# Patient Record
Sex: Male | Born: 1965
Health system: Southern US, Community
[De-identification: ages and names within clinical notes are randomized; demographics above are authoritative.]

## PROBLEM LIST (undated history)

## (undated) DIAGNOSIS — E785 Hyperlipidemia, unspecified: Secondary | ICD-10-CM

## (undated) DIAGNOSIS — K219 Gastro-esophageal reflux disease without esophagitis: Secondary | ICD-10-CM

## (undated) DIAGNOSIS — I10 Essential (primary) hypertension: Secondary | ICD-10-CM

## (undated) DIAGNOSIS — F32A Depression, unspecified: Secondary | ICD-10-CM

## (undated) DIAGNOSIS — C44602 Unspecified malignant neoplasm of skin of right upper limb, including shoulder: Secondary | ICD-10-CM

## (undated) DIAGNOSIS — G43909 Migraine, unspecified, not intractable, without status migrainosus: Secondary | ICD-10-CM

## (undated) HISTORY — PX: APPENDECTOMY: SHX54

## (undated) HISTORY — DX: Essential (primary) hypertension: I10

## (undated) HISTORY — DX: Migraine, unspecified, not intractable, without status migrainosus: G43.909

## (undated) HISTORY — DX: Hyperlipidemia, unspecified: E78.5

## (undated) HISTORY — DX: Unspecified malignant neoplasm of skin of right upper limb, including shoulder: C44.602

---

## 2015-10-20 LAB — HM COLONOSCOPY

## 2017-09-23 DIAGNOSIS — Z23 Encounter for immunization: Secondary | ICD-10-CM | POA: Diagnosis not present

## 2020-04-02 ENCOUNTER — Other Ambulatory Visit: Payer: Self-pay | Admitting: Physician Assistant

## 2020-09-19 ENCOUNTER — Other Ambulatory Visit: Payer: Self-pay

## 2020-09-19 ENCOUNTER — Ambulatory Visit: Payer: 59 | Admitting: Physician Assistant

## 2020-09-19 ENCOUNTER — Encounter: Payer: Self-pay | Admitting: Physician Assistant

## 2020-09-19 VITALS — BP 132/82 | HR 78 | Temp 98.0°F | Ht 67.0 in | Wt 190.8 lb

## 2020-09-19 DIAGNOSIS — I10 Essential (primary) hypertension: Secondary | ICD-10-CM

## 2020-09-19 DIAGNOSIS — R0789 Other chest pain: Secondary | ICD-10-CM

## 2020-09-19 DIAGNOSIS — E782 Mixed hyperlipidemia: Secondary | ICD-10-CM

## 2020-09-19 DIAGNOSIS — R5383 Other fatigue: Secondary | ICD-10-CM

## 2020-09-19 HISTORY — DX: Essential (primary) hypertension: I10

## 2020-09-19 HISTORY — DX: Other chest pain: R07.89

## 2020-09-19 HISTORY — DX: Mixed hyperlipidemia: E78.2

## 2020-09-19 HISTORY — DX: Other fatigue: R53.83

## 2020-09-19 MED ORDER — IBUPROFEN 800 MG PO TABS: 800.0000 mg | ORAL_TABLET | Freq: Three times a day (TID) | ORAL | 1 refills | Status: DC | PRN

## 2020-09-19 MED ORDER — ATENOLOL 25 MG PO TABS: 25.0000 mg | ORAL_TABLET | Freq: Every day | ORAL | 1 refills | Status: DC

## 2020-09-19 MED ORDER — OMEPRAZOLE 40 MG PO CPDR: 40.0000 mg | DELAYED_RELEASE_CAPSULE | Freq: Every day | ORAL | 1 refills | Status: DC

## 2020-09-19 NOTE — Assessment & Plan Note (Signed)
Recommend referral to cardiology Recommend to stop smoking Recommend to restart statin

## 2020-09-19 NOTE — Assessment & Plan Note (Signed)
Well controlled.  ?No changes to medicines.  ?Continue to work on eating a healthy diet and exercise.  ?Labs drawn today.  ?

## 2020-09-19 NOTE — Progress Notes (Signed)
Established Patient Office Visit  Subjective:  Patient ID: Darren Lyons, male    DOB: May 09, 1966  Age: 54 y.o. MRN: [id number]  CC:  Chief Complaint  Patient presents with  . Hypertension    HPI Darren Lyons presents for hypertension  Pt presents for follow up of hypertension.  The patient is tolerating the medication well without side effects. Compliance with treatment has been fair; including taking medication as directed ,  However pt states that for over a year he has noted he has occasional chest tightness - has noted it with exertion and it does cause pain down left arm - says he seems more fatigued than usual and more with exertion Last episode was several weeks ago  Pt with history of hyperlipidemia - he states that he stopped the chol med because he had 'kidney pain' - says it went away after a week off medication - - recommend to restart med but will wait for lab results  Pt uses omeprazole for GERD - states works well for him Past Medical History:  Diagnosis Date  . Hyperlipidemia   . Hypertension   . Migraine   . Skin cancer of arm, right     Past Surgical History:  Procedure Laterality Date  . APPENDECTOMY      Family History  Problem Relation Age of Onset  . Diabetes Mother   . Hypertension Mother   . Cancer Father     Social History   Socioeconomic History  . Marital status: Unknown    Spouse name: Not on file  . Number of children: Not on file  . Years of education: Not on file  . Highest education level: Not on file  Occupational History  . Not on file  Tobacco Use  . Smoking status: Current Every Day Smoker    Packs/day: 1.00    Types: Cigarettes  . Smokeless tobacco: Never Used  Vaping Use  . Vaping Use: Never used  Substance and Sexual Activity  . Alcohol use: Never  . Drug use: Never  . Sexual activity: Not on file  Other Topics Concern  . Not on file  Social History Narrative  . Not on file   Social Determinants of Health    Financial Resource Strain:   . Difficulty of Paying Living Expenses: Not on file  Food Insecurity:   . Worried About Charity fundraiser in the Last Year: Not on file  . Ran Out of Food in the Last Year: Not on file  Transportation Needs:   . Lack of Transportation (Medical): Not on file  . Lack of Transportation (Non-Medical): Not on file  Physical Activity:   . Days of Exercise per Week: Not on file  . Minutes of Exercise per Session: Not on file  Stress:   . Feeling of Stress : Not on file  Social Connections:   . Frequency of Communication with Friends and Family: Not on file  . Frequency of Social Gatherings with Friends and Family: Not on file  . Attends Religious Services: Not on file  . Active Member of Clubs or Organizations: Not on file  . Attends Archivist Meetings: Not on file  . Marital Status: Not on file  Intimate Partner Violence:   . Fear of Current or Ex-Partner: Not on file  . Emotionally Abused: Not on file  . Physically Abused: Not on file  . Sexually Abused: Not on file     Current Outpatient Medications:  .  atenolol (TENORMIN) 25 MG tablet, Take 1 tablet by mouth once daily, Disp: 90 tablet, Rfl: 0 .  ibuprofen (ADVIL) 800 MG tablet, TAKE 1 TABLET BY MOUTH EVERY 8 HOURS AS NEEDED FOR PAIN, Disp: 90 tablet, Rfl: 0 .  omeprazole (PRILOSEC) 40 MG capsule, Take 1 capsule by mouth once daily, Disp: 90 capsule, Rfl: 0   Allergies  Allergen Reactions  . Codeine   . Pseudoephedrine Hcl   . Zyrtec [Cetirizine]     ROS CONSTITUTIONAL: Negative for chills, fatigue, fever, unintentional weight gain and unintentional weight loss.  E/N/T: Negative for ear pain, nasal congestion and sore throat.  CARDIOVASCULAR: see HPI  RESPIRATORY: Negative for recent cough and dyspnea.  GASTROINTESTINAL: Negative for abdominal pain, acid reflux symptoms, constipation, diarrhea, nausea and vomiting.  MSK: Negative for arthralgias and myalgias.  INTEGUMENTARY:  Negative for rash.  NEUROLOGICAL: Negative for dizziness and headaches.  PSYCHIATRIC: Negative for sleep disturbance and to question depression screen.  Negative for depression, negative for anhedonia.        Objective:    PHYSICAL EXAM:   VS: BP 132/82 (BP Location: Left Arm, Patient Position: Sitting, Cuff Size: Normal)   Pulse 78   Temp 98 F (36.7 C) (Temporal)   Ht 5' 7"  (1.702 m)   Wt 190 lb 12.8 oz (86.5 kg)   SpO2 99%   BMI 29.88 kg/m   GEN: Well nourished, well developed, in no acute distress  Cardiac: RRR; no murmurs, rubs, or gallops,no edema -  Respiratory:  normal respiratory rate and pattern with no distress - normal breath sounds with no rales, rhonchi, wheezes or rubs GI: normal bowel sounds, no masses or tenderness MS: no deformity or atrophy  Skin: warm and dry, no rash  Neuro:  Alert and Oriented x 3, Strength and sensation are intact - CN II-Xii grossly intact Psych: euthymic mood, appropriate affect and demeanor  BP 132/82 (BP Location: Left Arm, Patient Position: Sitting, Cuff Size: Normal)   Pulse 78   Temp 98 F (36.7 C) (Temporal)   Ht 5' 7"  (1.702 m)   Wt 190 lb 12.8 oz (86.5 kg)   SpO2 99%   BMI 29.88 kg/m  Wt Readings from Last 3 Encounters:  09/19/20 190 lb 12.8 oz (86.5 kg)   ekg - t wave changes - no acute  There are no preventive care reminders to display for this patient.  There are no preventive care reminders to display for this patient.  No results found for: TSH No results found for: WBC, HGB, HCT, MCV, PLT No results found for: NA, K, CHLORIDE, CO2, GLUCOSE, BUN, CREATININE, BILITOT, ALKPHOS, AST, ALT, PROT, ALBUMIN, CALCIUM, ANIONGAP, EGFR, GFR No results found for: CHOL No results found for: HDL No results found for: LDLCALC No results found for: TRIG No results found for: CHOLHDL No results found for: HGBA1C    Assessment & Plan:   Problem List Items Addressed This Visit      Cardiovascular and Mediastinum    Essential hypertension - Primary    Well controlled.  No changes to medicines.  Continue to work on eating a healthy diet and exercise.  Labs drawn today.        Relevant Orders   CBC with Differential/Platelet   Comprehensive metabolic panel   TSH     Other   Mixed hyperlipidemia     Labs drawn today.  Will recommend to restart statin - to see lab results first  Relevant Orders   Lipid panel   Other fatigue    labwork pending      Relevant Orders   CBC with Differential/Platelet   Comprehensive metabolic panel   TSH   Other chest pain    Recommend referral to cardiology Recommend to stop smoking Recommend to restart statin      Relevant Orders   CBC with Differential/Platelet   Comprehensive metabolic panel   TSH   Lipid panel   EKG 12-Lead   Ambulatory referral to Cardiology      No orders of the defined types were placed in this encounter.   Follow-up: Return in about 6 months (around 03/19/2021) for chronic fasting follow up.    SARA R Airiel Oblinger, PA-C

## 2020-09-19 NOTE — Assessment & Plan Note (Signed)
  Labs drawn today.  Will recommend to restart statin - to see lab results first

## 2020-09-19 NOTE — Assessment & Plan Note (Signed)
labwork pending 

## 2020-09-20 LAB — COMPREHENSIVE METABOLIC PANEL
ALT: 20 IU/L (ref 0–44)
AST: 19 IU/L (ref 0–40)
Albumin/Globulin Ratio: 2.4 — ABNORMAL HIGH (ref 1.2–2.2)
Albumin: 4.8 g/dL (ref 3.8–4.9)
Alkaline Phosphatase: 96 IU/L (ref 44–121)
BUN/Creatinine Ratio: 15 (ref 9–20)
BUN: 15 mg/dL (ref 6–24)
Bilirubin Total: 0.6 mg/dL (ref 0.0–1.2)
CO2: 25 mmol/L (ref 20–29)
Calcium: 9.6 mg/dL (ref 8.7–10.2)
Chloride: 100 mmol/L (ref 96–106)
Creatinine, Ser: 1.01 mg/dL (ref 0.76–1.27)
GFR calc Af Amer: 96 mL/min/{1.73_m2} (ref >59)
GFR calc non Af Amer: 83 mL/min/{1.73_m2} (ref >59)
Globulin, Total: 2 g/dL (ref 1.5–4.5)
Glucose: 91 mg/dL (ref 65–99)
Potassium: 4.5 mmol/L (ref 3.5–5.2)
Sodium: 138 mmol/L (ref 134–144)
Total Protein: 6.8 g/dL (ref 6.0–8.5)

## 2020-09-20 LAB — CBC WITH DIFFERENTIAL/PLATELET
Basophils Absolute: 0.1 10*3/uL (ref 0.0–0.2)
Basos: 1 %
EOS (ABSOLUTE): 0.2 10*3/uL (ref 0.0–0.4)
Eos: 2 %
Hematocrit: 46.1 % (ref 37.5–51.0)
Hemoglobin: 15.7 g/dL (ref 13.0–17.7)
Immature Grans (Abs): 0 10*3/uL (ref 0.0–0.1)
Immature Granulocytes: 0 %
Lymphocytes Absolute: 2.6 10*3/uL (ref 0.7–3.1)
Lymphs: 33 %
MCH: 29.5 pg (ref 26.6–33.0)
MCHC: 34.1 g/dL (ref 31.5–35.7)
MCV: 87 fL (ref 79–97)
Monocytes Absolute: 0.4 10*3/uL (ref 0.1–0.9)
Monocytes: 6 %
Neutrophils Absolute: 4.6 10*3/uL (ref 1.4–7.0)
Neutrophils: 58 %
Platelets: 181 10*3/uL (ref 150–450)
RBC: 5.32 x10E6/uL (ref 4.14–5.80)
RDW: 13.6 % (ref 11.6–15.4)
WBC: 7.9 10*3/uL (ref 3.4–10.8)

## 2020-09-20 LAB — TSH: TSH: 2.21 u[IU]/mL (ref 0.450–4.500)

## 2020-09-20 LAB — LIPID PANEL
Chol/HDL Ratio: 7.8 ratio — ABNORMAL HIGH (ref 0.0–5.0)
Cholesterol, Total: 241 mg/dL — ABNORMAL HIGH (ref 100–199)
HDL: 31 mg/dL — ABNORMAL LOW (ref >39)
LDL Chol Calc (NIH): 163 mg/dL — ABNORMAL HIGH (ref 0–99)
Triglycerides: 249 mg/dL — ABNORMAL HIGH (ref 0–149)
VLDL Cholesterol Cal: 47 mg/dL — ABNORMAL HIGH (ref 5–40)

## 2020-09-20 LAB — CARDIOVASCULAR RISK ASSESSMENT

## 2020-09-21 ENCOUNTER — Other Ambulatory Visit: Payer: Self-pay | Admitting: Physician Assistant

## 2020-09-21 MED ORDER — ROSUVASTATIN CALCIUM 10 MG PO TABS: 10.0000 mg | ORAL_TABLET | Freq: Every day | ORAL | 0 refills | Status: DC

## 2020-09-28 DIAGNOSIS — C44602 Unspecified malignant neoplasm of skin of right upper limb, including shoulder: Secondary | ICD-10-CM | POA: Insufficient documentation

## 2020-09-28 DIAGNOSIS — I10 Essential (primary) hypertension: Secondary | ICD-10-CM | POA: Insufficient documentation

## 2020-09-28 DIAGNOSIS — E785 Hyperlipidemia, unspecified: Secondary | ICD-10-CM | POA: Insufficient documentation

## 2020-09-28 DIAGNOSIS — G43909 Migraine, unspecified, not intractable, without status migrainosus: Secondary | ICD-10-CM | POA: Insufficient documentation

## 2020-09-29 ENCOUNTER — Other Ambulatory Visit: Payer: Self-pay

## 2020-09-29 ENCOUNTER — Ambulatory Visit: Payer: 59 | Admitting: Cardiology

## 2020-09-29 ENCOUNTER — Encounter: Payer: Self-pay | Admitting: Cardiology

## 2020-09-29 VITALS — BP 142/90 | HR 60 | Ht 66.0 in | Wt 188.4 lb

## 2020-09-29 DIAGNOSIS — I209 Angina pectoris, unspecified: Secondary | ICD-10-CM | POA: Diagnosis not present

## 2020-09-29 DIAGNOSIS — E782 Mixed hyperlipidemia: Secondary | ICD-10-CM | POA: Diagnosis not present

## 2020-09-29 DIAGNOSIS — F17210 Nicotine dependence, cigarettes, uncomplicated: Secondary | ICD-10-CM

## 2020-09-29 DIAGNOSIS — I10 Essential (primary) hypertension: Secondary | ICD-10-CM | POA: Diagnosis not present

## 2020-09-29 MED ORDER — ASPIRIN EC 81 MG PO TBEC: 81.0000 mg | DELAYED_RELEASE_TABLET | Freq: Every day | ORAL | 3 refills | Status: DC

## 2020-09-29 MED ORDER — NITROGLYCERIN 0.4 MG SL SUBL: 0.4000 mg | SUBLINGUAL_TABLET | SUBLINGUAL | 3 refills | Status: DC | PRN

## 2020-09-29 NOTE — Progress Notes (Signed)
Cardiology Office Note:    Date:  09/29/2020   ID:  Darren Lyons, DOB [birth year], MRN [id number]  PCP:  Marge Duncans, PA-C  Cardiologist:  Jenean Lindau, MD   Referring MD: Marge Duncans, PA-C    ASSESSMENT:    1. Essential hypertension   2. Mixed hyperlipidemia   3. Angina pectoris ([place])   4. Cigarette smoker    PLAN:    In order of problems listed above:  1. Angina pectoris: Patient symptoms are very concerning and I discussed this with him and his wife at extensive length.  I told him to rest and relax till his further evaluation.  Advised him the following.  He will take aspirin 81 mg daily coated.  Sublingual nitroglycerin prescription was sent, its protocol and 911 protocol explained and the patient vocalized understanding questions were answered to the patient's satisfaction.In view of the patient's symptoms, I discussed with the patient options for evaluation. Invasive and noninvasive options were given to the patient. I discussed stress testing and coronary angiography and left heart catheterization at length. Benefits, pros and cons of each approach were discussed at length. Patient had multiple questions which were answered to the patient's satisfaction. Patient opted for invasive evaluation and we will set up for coronary angiography and left heart catheterization. Further recommendations will be made based on the findings with coronary angiography. In the interim if the patient has any significant symptoms in hospital to the nearest emergency room.  I also suggested CT coronary angiography with FFR but is not keen on it. 2. Essential hypertension: Blood pressure is mildly elevated but he appears anxious today because of obvious reasons 3. Mixed dyslipidemia: Diet was emphasized.  His doctor has already started him on statin therapy and I think that is appropriate 4. Cigarette smoker: I spent 5 minutes with the patient discussing solely about smoking. Smoking cessation was  counseled. I suggested to the patient also different medications and pharmacological interventions. Patient is keen to try stopping on its own at this time. He will get back to me if he needs any further assistance in this matter. 5. He will be seen in follow-up appointment after the coronary angiography.   Medication Adjustments/Labs and Tests Ordered: Current medicines are reviewed at length with the patient today.  Concerns regarding medicines are outlined above.  Orders Placed This Encounter  Procedures  . Basic metabolic panel  . CBC   Meds ordered this encounter  Medications  . aspirin EC 81 MG tablet    Sig: Take 1 tablet (81 mg total) by mouth daily. Swallow whole.    Dispense:  90 tablet    Refill:  3  . nitroGLYCERIN (NITROSTAT) 0.4 MG SL tablet    Sig: Place 1 tablet (0.4 mg total) under the tongue every 5 (five) minutes as needed for chest pain.    Dispense:  90 tablet    Refill:  3     History of Present Illness:    Darren Lyons is a 54 y.o. male who is being seen today for the evaluation of chest pain at the request of Marge Duncans, Hershal Coria.  Patient is a pleasant 54 year old male.  He has past medical history of essential hypertension dyslipidemia, sedentary lifestyle and active heavy smoker.  Patient has been referred to me for chest pain.  He is wife accompanies him for this visit.  He mentions to me that he has chest tightness going to the neck into the arm.  This occurs on exertion.  He has never used nitroglycerin.  He is concerned about this.  Also sexual activity being around the symptoms and so he has been very concerned about not being sexually active because of recurrence of symptoms.  At the time of my evaluation, the patient is alert awake oriented and in no distress.  Past Medical History:  Diagnosis Date  . Essential hypertension 09/19/2020  . Hyperlipidemia   . Hypertension   . Migraine   . Mixed hyperlipidemia 09/19/2020  . Other chest pain 09/19/2020  .  Other fatigue 09/19/2020  . Skin cancer of arm, right     Past Surgical History:  Procedure Laterality Date  . APPENDECTOMY      Current Medications: Current Meds  Medication Sig  . atenolol (TENORMIN) 25 MG tablet Take 1 tablet (25 mg total) by mouth daily.  Marland Kitchen ibuprofen (ADVIL) 800 MG tablet Take 1 tablet (800 mg total) by mouth every 8 (eight) hours as needed. for pain  . omeprazole (PRILOSEC) 40 MG capsule Take 1 capsule (40 mg total) by mouth daily.  . rosuvastatin (CRESTOR) 10 MG tablet Take 1 tablet (10 mg total) by mouth daily.     Allergies:   Codeine, Pseudoephedrine hcl, and Zyrtec [cetirizine]   Social History   Socioeconomic History  . Marital status: Unknown    Spouse name: Not on file  . Number of children: Not on file  . Years of education: Not on file  . Highest education level: Not on file  Occupational History  . Not on file  Tobacco Use  . Smoking status: Current Every Day Smoker    Packs/day: 1.00    Types: Cigarettes  . Smokeless tobacco: Never Used  Vaping Use  . Vaping Use: Never used  Substance and Sexual Activity  . Alcohol use: Never  . Drug use: Never  . Sexual activity: Not on file  Other Topics Concern  . Not on file  Social History Narrative  . Not on file   Social Determinants of Health   Financial Resource Strain:   . Difficulty of Paying Living Expenses: Not on file  Food Insecurity:   . Worried About Charity fundraiser in the Last Year: Not on file  . Ran Out of Food in the Last Year: Not on file  Transportation Needs:   . Lack of Transportation (Medical): Not on file  . Lack of Transportation (Non-Medical): Not on file  Physical Activity:   . Days of Exercise per Week: Not on file  . Minutes of Exercise per Session: Not on file  Stress:   . Feeling of Stress : Not on file  Social Connections:   . Frequency of Communication with Friends and Family: Not on file  . Frequency of Social Gatherings with Friends and Family:  Not on file  . Attends Religious Services: Not on file  . Active Member of Clubs or Organizations: Not on file  . Attends Archivist Meetings: Not on file  . Marital Status: Not on file     Family History: The patient's family history includes Cancer in his father; Diabetes in his mother; Hypertension in his mother.  ROS:   Please see the history of present illness.    All other systems reviewed and are negative.  EKGs/Labs/Other Studies Reviewed:    The following studies were reviewed today: EKG with sinus rhythm and veh.   Recent Labs: 09/19/2020: ALT 20; BUN 15; Creatinine, Ser 1.01; Hemoglobin 15.7; Platelets 181; Potassium 4.5; Sodium  138; TSH 2.210  Recent Lipid Panel    Component Value Date/Time   CHOL 241 (H) 09/19/2020 1008   TRIG 249 (H) 09/19/2020 1008   HDL 31 (L) 09/19/2020 1008   CHOLHDL 7.8 (H) 09/19/2020 1008   LDLCALC 163 (H) 09/19/2020 1008    Physical Exam:    VS:  BP (!) 142/90 (BP Location: Left Arm, Patient Position: Sitting, Cuff Size: Normal)   Pulse 60   Ht 5\' 6"  (1.676 m)   Wt 188 lb 6.4 oz (85.5 kg)   SpO2 99%   BMI 30.41 kg/m     Wt Readings from Last 3 Encounters:  09/29/20 188 lb 6.4 oz (85.5 kg)  09/19/20 190 lb 12.8 oz (86.5 kg)     GEN: Patient is in no acute distress HEENT: Normal NECK: No JVD; No carotid bruits LYMPHATICS: No lymphadenopathy CARDIAC: S1 S2 regular, 2/6 systolic murmur at the apex. RESPIRATORY:  Clear to auscultation without rales, wheezing or rhonchi  ABDOMEN: Soft, non-tender, non-distended MUSCULOSKELETAL:  No edema; No deformity  SKIN: Warm and dry NEUROLOGIC:  Alert and oriented x 3 PSYCHIATRIC:  Normal affect    Signed, Jenean Lindau, MD  09/29/2020 9:10 AM    [place]

## 2020-09-29 NOTE — Patient Instructions (Addendum)
Medication Instructions:  Your physician has recommended you make the following change in your medication:  START: Nitroglycerin 0.4 mg take one tablet by mouth every 5 minutes as needed for chest pain.  START: Aspirin 81 mg take one tablet by mouth daily.  *If you need a refill on your cardiac medications before your next appointment, please call your pharmacy*   Lab Work: None If you have labs (blood work) drawn today and your tests are completely normal, you will receive your results only by: Marland Kitchen MyChart Message (if you have MyChart) OR . A paper copy in the mail If you have any lab test that is abnormal or we need to change your treatment, we will call you to review the results.   Testing/Procedures:    [facility] [address] Dept: [phone] Loc: [place]  09/29/2020  You are scheduled for a Cardiac Catheterization on Monday, October 11 with Dr. Shelva Majestic.  1. Please arrive at the [facility] (Main Entrance A) at [facility]: [address] [place], [place] [address] at 8:30 AM (This time is two hours before your procedure to ensure your preparation). Free valet parking service is available.   Special note: Every effort is made to have your procedure done on time. Please understand that emergencies sometimes delay scheduled procedures.  2. Diet: Do not eat solid foods after midnight.  The patient may have clear liquids until 5am upon the day of the procedure.  3. Labs: You had your labs completed on 09/19/20  4. Medication instructions in preparation for your procedure:   Contrast Allergy: No   On the morning of your procedure, take your Aspirin and any morning medicines NOT listed above.  You may use sips of water.  5. Plan for one night stay--bring personal belongings. 6. Bring a current list of your medications and current insurance  cards. 7. You MUST have a responsible person to drive you home. 8. Someone MUST be with you the first 24 hours after you arrive home or your discharge will be delayed. 9. Please wear clothes that are easy to get on and off and wear slip-on shoes.  Thank you for allowing [place] to care for you!   --  Invasive Cardiovascular services    Follow-Up: At [facility], you and your health needs are our priority.  As part of our continuing mission to provide you with exceptional heart care, we have created designated Provider Care Teams.  These Care Teams include your primary Cardiologist (physician) and Advanced Practice Providers (APPs -  Physician Assistants and Nurse Practitioners) who all work together to provide you with the care you need, when you need it.  We recommend signing up for the patient portal called "MyChart".  Sign up information is provided on this After Visit Summary.  MyChart is used to connect with patients for Virtual Visits (Telemedicine).  Patients are able to view lab/test results, encounter notes, upcoming appointments, etc.  Non-urgent messages can be sent to your provider as well.   To learn more about what you can do with MyChart, go to [email or web address].    Your next appointment:   1 month(s)  The format for your next appointment:   In Person  Provider:   Jyl Heinz, MD   Other Instructions

## 2020-09-29 NOTE — H&P (View-Only) (Signed)
Cardiology Office Note:    Date:  09/29/2020   ID:  Darren Lyons, DOB [birth year], MRN [id number]  PCP:  Marge Duncans, PA-C  Cardiologist:  Jenean Lindau, MD   Referring MD: Marge Duncans, PA-C    ASSESSMENT:    1. Essential hypertension   2. Mixed hyperlipidemia   3. Angina pectoris ([place])   4. Cigarette smoker    PLAN:    In order of problems listed above:  1. Angina pectoris: Patient symptoms are very concerning and I discussed this with him and his wife at extensive length.  I told him to rest and relax till his further evaluation.  Advised him the following.  He will take aspirin 81 mg daily coated.  Sublingual nitroglycerin prescription was sent, its protocol and 911 protocol explained and the patient vocalized understanding questions were answered to the patient's satisfaction.In view of the patient's symptoms, I discussed with the patient options for evaluation. Invasive and noninvasive options were given to the patient. I discussed stress testing and coronary angiography and left heart catheterization at length. Benefits, pros and cons of each approach were discussed at length. Patient had multiple questions which were answered to the patient's satisfaction. Patient opted for invasive evaluation and we will set up for coronary angiography and left heart catheterization. Further recommendations will be made based on the findings with coronary angiography. In the interim if the patient has any significant symptoms in hospital to the nearest emergency room.  I also suggested CT coronary angiography with FFR but is not keen on it. 2. Essential hypertension: Blood pressure is mildly elevated but he appears anxious today because of obvious reasons 3. Mixed dyslipidemia: Diet was emphasized.  His doctor has already started him on statin therapy and I think that is appropriate 4. Cigarette smoker: I spent 5 minutes with the patient discussing solely about smoking. Smoking cessation was  counseled. I suggested to the patient also different medications and pharmacological interventions. Patient is keen to try stopping on its own at this time. He will get back to me if he needs any further assistance in this matter. 5. He will be seen in follow-up appointment after the coronary angiography.   Medication Adjustments/Labs and Tests Ordered: Current medicines are reviewed at length with the patient today.  Concerns regarding medicines are outlined above.  Orders Placed This Encounter  Procedures  . Basic metabolic panel  . CBC   Meds ordered this encounter  Medications  . aspirin EC 81 MG tablet    Sig: Take 1 tablet (81 mg total) by mouth daily. Swallow whole.    Dispense:  90 tablet    Refill:  3  . nitroGLYCERIN (NITROSTAT) 0.4 MG SL tablet    Sig: Place 1 tablet (0.4 mg total) under the tongue every 5 (five) minutes as needed for chest pain.    Dispense:  90 tablet    Refill:  3     History of Present Illness:    Darren Lyons is a 54 y.o. male who is being seen today for the evaluation of chest pain at the request of Marge Duncans, Hershal Coria.  Patient is a pleasant 54 year old male.  He has past medical history of essential hypertension dyslipidemia, sedentary lifestyle and active heavy smoker.  Patient has been referred to me for chest pain.  He is wife accompanies him for this visit.  He mentions to me that he has chest tightness going to the neck into the arm.  This occurs on exertion.  He has never used nitroglycerin.  He is concerned about this.  Also sexual activity being around the symptoms and so he has been very concerned about not being sexually active because of recurrence of symptoms.  At the time of my evaluation, the patient is alert awake oriented and in no distress.  Past Medical History:  Diagnosis Date  . Essential hypertension 09/19/2020  . Hyperlipidemia   . Hypertension   . Migraine   . Mixed hyperlipidemia 09/19/2020  . Other chest pain 09/19/2020  .  Other fatigue 09/19/2020  . Skin cancer of arm, right     Past Surgical History:  Procedure Laterality Date  . APPENDECTOMY      Current Medications: Current Meds  Medication Sig  . atenolol (TENORMIN) 25 MG tablet Take 1 tablet (25 mg total) by mouth daily.  Marland Kitchen ibuprofen (ADVIL) 800 MG tablet Take 1 tablet (800 mg total) by mouth every 8 (eight) hours as needed. for pain  . omeprazole (PRILOSEC) 40 MG capsule Take 1 capsule (40 mg total) by mouth daily.  . rosuvastatin (CRESTOR) 10 MG tablet Take 1 tablet (10 mg total) by mouth daily.     Allergies:   Codeine, Pseudoephedrine hcl, and Zyrtec [cetirizine]   Social History   Socioeconomic History  . Marital status: Unknown    Spouse name: Not on file  . Number of children: Not on file  . Years of education: Not on file  . Highest education level: Not on file  Occupational History  . Not on file  Tobacco Use  . Smoking status: Current Every Day Smoker    Packs/day: 1.00    Types: Cigarettes  . Smokeless tobacco: Never Used  Vaping Use  . Vaping Use: Never used  Substance and Sexual Activity  . Alcohol use: Never  . Drug use: Never  . Sexual activity: Not on file  Other Topics Concern  . Not on file  Social History Narrative  . Not on file   Social Determinants of Health   Financial Resource Strain:   . Difficulty of Paying Living Expenses: Not on file  Food Insecurity:   . Worried About Charity fundraiser in the Last Year: Not on file  . Ran Out of Food in the Last Year: Not on file  Transportation Needs:   . Lack of Transportation (Medical): Not on file  . Lack of Transportation (Non-Medical): Not on file  Physical Activity:   . Days of Exercise per Week: Not on file  . Minutes of Exercise per Session: Not on file  Stress:   . Feeling of Stress : Not on file  Social Connections:   . Frequency of Communication with Friends and Family: Not on file  . Frequency of Social Gatherings with Friends and Family:  Not on file  . Attends Religious Services: Not on file  . Active Member of Clubs or Organizations: Not on file  . Attends Archivist Meetings: Not on file  . Marital Status: Not on file     Family History: The patient's family history includes Cancer in his father; Diabetes in his mother; Hypertension in his mother.  ROS:   Please see the history of present illness.    All other systems reviewed and are negative.  EKGs/Labs/Other Studies Reviewed:    The following studies were reviewed today: EKG with sinus rhythm and veh.   Recent Labs: 09/19/2020: ALT 20; BUN 15; Creatinine, Ser 1.01; Hemoglobin 15.7; Platelets 181; Potassium 4.5; Sodium  138; TSH 2.210  Recent Lipid Panel    Component Value Date/Time   CHOL 241 (H) 09/19/2020 1008   TRIG 249 (H) 09/19/2020 1008   HDL 31 (L) 09/19/2020 1008   CHOLHDL 7.8 (H) 09/19/2020 1008   LDLCALC 163 (H) 09/19/2020 1008    Physical Exam:    VS:  BP (!) 142/90 (BP Location: Left Arm, Patient Position: Sitting, Cuff Size: Normal)   Pulse 60   Ht 5\' 6"  (1.676 m)   Wt 188 lb 6.4 oz (85.5 kg)   SpO2 99%   BMI 30.41 kg/m     Wt Readings from Last 3 Encounters:  09/29/20 188 lb 6.4 oz (85.5 kg)  09/19/20 190 lb 12.8 oz (86.5 kg)     GEN: Patient is in no acute distress HEENT: Normal NECK: No JVD; No carotid bruits LYMPHATICS: No lymphadenopathy CARDIAC: S1 S2 regular, 2/6 systolic murmur at the apex. RESPIRATORY:  Clear to auscultation without rales, wheezing or rhonchi  ABDOMEN: Soft, non-tender, non-distended MUSCULOSKELETAL:  No edema; No deformity  SKIN: Warm and dry NEUROLOGIC:  Alert and oriented x 3 PSYCHIATRIC:  Normal affect    Signed, Jenean Lindau, MD  09/29/2020 9:10 AM    [place]

## 2020-09-29 NOTE — Addendum Note (Signed)
Addended by: Resa Miner I on: 09/29/2020 09:20 AM   Modules accepted: Orders

## 2020-09-30 ENCOUNTER — Other Ambulatory Visit (HOSPITAL_COMMUNITY)
Admission: RE | Admit: 2020-09-30 | Discharge: 2020-09-30 | Disposition: A | Discharge status: Home / Self Care | Payer: 59 | Source: Ambulatory Visit | Attending: Cardiovascular Disease | Admitting: Cardiovascular Disease

## 2020-09-30 DIAGNOSIS — Z20822 Contact with and (suspected) exposure to covid-19: Secondary | ICD-10-CM | POA: Diagnosis not present

## 2020-09-30 DIAGNOSIS — Z01812 Encounter for preprocedural laboratory examination: Secondary | ICD-10-CM | POA: Insufficient documentation

## 2020-09-30 LAB — SARS CORONAVIRUS 2 (TAT 6-24 HRS): SARS Coronavirus 2: NEGATIVE

## 2020-10-02 ENCOUNTER — Telehealth: Payer: Self-pay | Admitting: Cardiology

## 2020-10-02 ENCOUNTER — Ambulatory Visit (HOSPITAL_BASED_OUTPATIENT_CLINIC_OR_DEPARTMENT_OTHER): Payer: 59

## 2020-10-02 ENCOUNTER — Encounter (HOSPITAL_COMMUNITY)
Admission: RE | Disposition: A | Discharge status: Home / Self Care | Payer: 59 | Source: Home / Self Care | Attending: Cardiovascular Disease

## 2020-10-02 ENCOUNTER — Ambulatory Visit (HOSPITAL_COMMUNITY)
Admission: RE | Admit: 2020-10-02 | Discharge: 2020-10-02 | Disposition: A | Discharge status: Home / Self Care | Payer: 59 | Attending: Cardiovascular Disease | Admitting: Cardiovascular Disease

## 2020-10-02 DIAGNOSIS — Z8249 Family history of ischemic heart disease and other diseases of the circulatory system: Secondary | ICD-10-CM | POA: Diagnosis not present

## 2020-10-02 DIAGNOSIS — Z888 Allergy status to other drugs, medicaments and biological substances status: Secondary | ICD-10-CM | POA: Diagnosis not present

## 2020-10-02 DIAGNOSIS — Z79899 Other long term (current) drug therapy: Secondary | ICD-10-CM | POA: Diagnosis not present

## 2020-10-02 DIAGNOSIS — I350 Nonrheumatic aortic (valve) stenosis: Secondary | ICD-10-CM

## 2020-10-02 DIAGNOSIS — Z7982 Long term (current) use of aspirin: Secondary | ICD-10-CM | POA: Insufficient documentation

## 2020-10-02 DIAGNOSIS — Z885 Allergy status to narcotic agent status: Secondary | ICD-10-CM | POA: Insufficient documentation

## 2020-10-02 DIAGNOSIS — I209 Angina pectoris, unspecified: Secondary | ICD-10-CM

## 2020-10-02 DIAGNOSIS — I10 Essential (primary) hypertension: Secondary | ICD-10-CM | POA: Diagnosis not present

## 2020-10-02 DIAGNOSIS — Z85828 Personal history of other malignant neoplasm of skin: Secondary | ICD-10-CM | POA: Diagnosis not present

## 2020-10-02 DIAGNOSIS — F17210 Nicotine dependence, cigarettes, uncomplicated: Secondary | ICD-10-CM | POA: Diagnosis not present

## 2020-10-02 DIAGNOSIS — I25119 Atherosclerotic heart disease of native coronary artery with unspecified angina pectoris: Secondary | ICD-10-CM | POA: Insufficient documentation

## 2020-10-02 DIAGNOSIS — E782 Mixed hyperlipidemia: Secondary | ICD-10-CM | POA: Diagnosis not present

## 2020-10-02 DIAGNOSIS — I272 Pulmonary hypertension, unspecified: Secondary | ICD-10-CM | POA: Insufficient documentation

## 2020-10-02 HISTORY — PX: RIGHT HEART CATH: CATH118263

## 2020-10-02 HISTORY — PX: LEFT HEART CATH AND CORONARY ANGIOGRAPHY: CATH118249

## 2020-10-02 LAB — ECHOCARDIOGRAM COMPLETE
AR max vel: 0.67 cm2
AV Area VTI: 0.63 cm2
AV Area mean vel: 0.63 cm2
AV Mean grad: 30.3 mmHg
AV Peak grad: 49.7 mmHg
Ao pk vel: 3.52 m/s
Area-P 1/2: 4.06 cm2
Height: 66 in
S' Lateral: 2.5 cm
Weight: 3008 oz

## 2020-10-02 LAB — POCT I-STAT EG7
Acid-Base Excess: 0 mmol/L (ref 0.0–2.0)
Acid-Base Excess: 1 mmol/L (ref 0.0–2.0)
Bicarbonate: 26.7 mmol/L (ref 20.0–28.0)
Bicarbonate: 26.8 mmol/L (ref 20.0–28.0)
Calcium, Ion: 1.21 mmol/L (ref 1.15–1.40)
Calcium, Ion: 1.22 mmol/L (ref 1.15–1.40)
HCT: 46 % (ref 39.0–52.0)
HCT: 47 % (ref 39.0–52.0)
Hemoglobin: 15.6 g/dL (ref 13.0–17.0)
Hemoglobin: 16 g/dL (ref 13.0–17.0)
O2 Saturation: 66 %
O2 Saturation: 69 %
Potassium: 4.3 mmol/L (ref 3.5–5.1)
Potassium: 4.4 mmol/L (ref 3.5–5.1)
Sodium: 139 mmol/L (ref 135–145)
Sodium: 139 mmol/L (ref 135–145)
TCO2: 28 mmol/L (ref 22–32)
TCO2: 28 mmol/L (ref 22–32)
pCO2, Ven: 47.9 mmHg (ref 44.0–60.0)
pCO2, Ven: 48.4 mmHg (ref 44.0–60.0)
pH, Ven: 7.35 (ref 7.250–7.430)
pH, Ven: 7.356 (ref 7.250–7.430)
pO2, Ven: 36 mmHg (ref 32.0–45.0)
pO2, Ven: 38 mmHg (ref 32.0–45.0)

## 2020-10-02 LAB — POCT I-STAT 7, (LYTES, BLD GAS, ICA,H+H)
Acid-base deficit: 2 mmol/L (ref 0.0–2.0)
Bicarbonate: 24.1 mmol/L (ref 20.0–28.0)
Calcium, Ion: 1.17 mmol/L (ref 1.15–1.40)
HCT: 44 % (ref 39.0–52.0)
Hemoglobin: 15 g/dL (ref 13.0–17.0)
O2 Saturation: 98 %
Potassium: 4.1 mmol/L (ref 3.5–5.1)
Sodium: 134 mmol/L — ABNORMAL LOW (ref 135–145)
TCO2: 25 mmol/L (ref 22–32)
pCO2 arterial: 45.3 mmHg (ref 32.0–48.0)
pH, Arterial: 7.334 — ABNORMAL LOW (ref 7.350–7.450)
pO2, Arterial: 111 mmHg — ABNORMAL HIGH (ref 83.0–108.0)

## 2020-10-02 SURGERY — LEFT HEART CATH AND CORONARY ANGIOGRAPHY
Anesthesia: LOCAL

## 2020-10-02 MED ORDER — ACETAMINOPHEN 325 MG PO TABS: 650.0000 mg | ORAL_TABLET | ORAL | Status: DC | PRN

## 2020-10-02 MED ORDER — FENTANYL CITRATE (PF) 100 MCG/2ML IJ SOLN
INTRAMUSCULAR | Status: DC | PRN
Start: 2020-10-02 — End: 2020-10-02
  Administered 2020-10-02: 50 ug via INTRAVENOUS

## 2020-10-02 MED ORDER — LIDOCAINE HCL (PF) 1 % IJ SOLN
INTRAMUSCULAR | Status: DC | PRN
  Administered 2020-10-02: 2 mL

## 2020-10-02 MED ORDER — SODIUM CHLORIDE 0.9 % IV SOLN: 250.0000 mL | INTRAVENOUS | Status: DC | PRN

## 2020-10-02 MED ORDER — IOHEXOL 350 MG/ML SOLN
INTRAVENOUS | Status: DC | PRN
  Administered 2020-10-02: 75 mL

## 2020-10-02 MED ORDER — HEPARIN SODIUM (PORCINE) 1000 UNIT/ML IJ SOLN
INTRAMUSCULAR | Status: AC
  Filled 2020-10-02: qty 1

## 2020-10-02 MED ORDER — HEPARIN SODIUM (PORCINE) 1000 UNIT/ML IJ SOLN
INTRAMUSCULAR | Status: DC | PRN
  Administered 2020-10-02: 4300 [IU] via INTRAVENOUS

## 2020-10-02 MED ORDER — ONDANSETRON HCL 4 MG/2ML IJ SOLN: 4.0000 mg | Freq: Four times a day (QID) | INTRAMUSCULAR | Status: DC | PRN

## 2020-10-02 MED ORDER — SODIUM CHLORIDE 0.9 % IV SOLN: INTRAVENOUS | Status: DC

## 2020-10-02 MED ORDER — LIDOCAINE HCL (PF) 1 % IJ SOLN
INTRAMUSCULAR | Status: AC
  Filled 2020-10-02: qty 30

## 2020-10-02 MED ORDER — HEPARIN (PORCINE) IN NACL 1000-0.9 UT/500ML-% IV SOLN
INTRAVENOUS | Status: DC | PRN
  Administered 2020-10-02 (×2): 500 mL

## 2020-10-02 MED ORDER — SODIUM CHLORIDE 0.9% FLUSH: 3.0000 mL | INTRAVENOUS | Status: DC | PRN

## 2020-10-02 MED ORDER — SODIUM CHLORIDE 0.9% FLUSH: 3.0000 mL | Freq: Two times a day (BID) | INTRAVENOUS | Status: DC

## 2020-10-02 MED ORDER — LABETALOL HCL 5 MG/ML IV SOLN: 10.0000 mg | INTRAVENOUS | Status: DC | PRN

## 2020-10-02 MED ORDER — SODIUM CHLORIDE 0.9 % WEIGHT BASED INFUSION: 1.0000 mL/kg/h | INTRAVENOUS | Status: DC

## 2020-10-02 MED ORDER — ASPIRIN 81 MG PO CHEW: 81.0000 mg | CHEWABLE_TABLET | ORAL | Status: DC

## 2020-10-02 MED ORDER — SODIUM CHLORIDE 0.9 % WEIGHT BASED INFUSION
3.0000 mL/kg/h | INTRAVENOUS | Status: DC
  Administered 2020-10-02: 3 mL/kg/h via INTRAVENOUS

## 2020-10-02 MED ORDER — HEPARIN (PORCINE) IN NACL 1000-0.9 UT/500ML-% IV SOLN
INTRAVENOUS | Status: AC
  Filled 2020-10-02: qty 1000

## 2020-10-02 MED ORDER — MIDAZOLAM HCL 2 MG/2ML IJ SOLN
INTRAMUSCULAR | Status: AC
  Filled 2020-10-02: qty 2

## 2020-10-02 MED ORDER — FENTANYL CITRATE (PF) 100 MCG/2ML IJ SOLN
INTRAMUSCULAR | Status: AC
  Filled 2020-10-02: qty 2

## 2020-10-02 MED ORDER — ASPIRIN 81 MG PO CHEW: 81.0000 mg | CHEWABLE_TABLET | Freq: Every day | ORAL | Status: DC

## 2020-10-02 MED ORDER — VERAPAMIL HCL 2.5 MG/ML IV SOLN
INTRAVENOUS | Status: DC | PRN
  Administered 2020-10-02: 10 mL via INTRA_ARTERIAL

## 2020-10-02 MED ORDER — DIAZEPAM 5 MG PO TABS: 5.0000 mg | ORAL_TABLET | ORAL | Status: DC | PRN

## 2020-10-02 MED ORDER — VERAPAMIL HCL 2.5 MG/ML IV SOLN
INTRAVENOUS | Status: AC
  Filled 2020-10-02: qty 2

## 2020-10-02 MED ORDER — MIDAZOLAM HCL 2 MG/2ML IJ SOLN
INTRAMUSCULAR | Status: DC | PRN
  Administered 2020-10-02: 1 mg via INTRAVENOUS
  Administered 2020-10-02: 2 mg via INTRAVENOUS

## 2020-10-02 MED ORDER — HYDRALAZINE HCL 20 MG/ML IJ SOLN: 10.0000 mg | INTRAMUSCULAR | Status: DC | PRN

## 2020-10-02 SURGICAL SUPPLY — 15 items
CATH INFINITI 5 FR JL3.5 (CATHETERS) ×2 IMPLANT
CATH INFINITI JR4 5F (CATHETERS) ×2 IMPLANT
CATH OPTITORQUE TIG 4.0 5F (CATHETERS) ×2 IMPLANT
CATH SWAN GANZ 7F STRAIGHT (CATHETERS) ×2 IMPLANT
DEVICE RAD COMP TR BAND LRG (VASCULAR PRODUCTS) ×2 IMPLANT
GLIDESHEATH SLEND SS 6F .021 (SHEATH) ×2 IMPLANT
GLIDESHEATH SLENDER 7FR .021G (SHEATH) ×2 IMPLANT
GUIDEWIRE .025 260CM (WIRE) ×2 IMPLANT
GUIDEWIRE INQWIRE 1.5J.035X260 (WIRE) ×1 IMPLANT
INQWIRE 1.5J .035X260CM (WIRE) ×2
KIT HEART LEFT (KITS) ×2 IMPLANT
PACK CARDIAC CATHETERIZATION (CUSTOM PROCEDURE TRAY) ×2 IMPLANT
TRANSDUCER W/STOPCOCK (MISCELLANEOUS) ×2 IMPLANT
TUBING CIL FLEX 10 FLL-RA (TUBING) ×2 IMPLANT
WIRE EMERALD ST .035X150CM (WIRE) ×2 IMPLANT

## 2020-10-02 NOTE — Progress Notes (Addendum)
Discharge instructions reviewed with pt and his wife (via telephone) both voice understanding. Pt wife states she was told to have Darren Lyons follow up with his cardiologist before going back to work. Pt informed voices understanding.

## 2020-10-02 NOTE — Progress Notes (Signed)
Ambulated to bathroom to void tol well  

## 2020-10-02 NOTE — Interval H&P Note (Signed)
Cath Lab Visit (complete for each Cath Lab visit)  Clinical Evaluation Leading to the Procedure:   ACS: No.  Non-ACS:    Anginal Classification: CCS III  Anti-ischemic medical therapy: Minimal Therapy (1 class of medications)  Non-Invasive Test Results: No non-invasive testing performed  Prior CABG: No previous CABG      History and Physical Interval Note:  10/02/2020 10:11 AM  Darren Lyons  has presented today for surgery, with the diagnosis of CAD.  The various methods of treatment have been discussed with the patient and family. After consideration of risks, benefits and other options for treatment, the patient has consented to  Procedure(s): LEFT HEART CATH AND CORONARY ANGIOGRAPHY (N/A) as a surgical intervention.  The patient's history has been reviewed, patient examined, no change in status, stable for surgery.  I have reviewed the patient's chart and labs.  Questions were answered to the patient's satisfaction.     Shelva Majestic

## 2020-10-02 NOTE — Discharge Instructions (Signed)
Radial Site Care  This sheet gives you information about how to care for yourself after your procedure. Your health care provider may also give you more specific instructions. If you have problems or questions, contact your health care provider. What can I expect after the procedure? After the procedure, it is common to have:  Bruising and tenderness at the catheter insertion area. Follow these instructions at home: Medicines  Take over-the-counter and prescription medicines only as told by your health care provider. Insertion site care  Follow instructions from your health care provider about how to take care of your insertion site. Make sure you: ? Wash your hands with soap and water before you change your bandage (dressing). If soap and water are not available, use hand sanitizer. ? Change your dressing as told by your health care provider. ? Leave stitches (sutures), skin glue, or adhesive strips in place. These skin closures may need to stay in place for 2 weeks or longer. If adhesive strip edges start to loosen and curl up, you may trim the loose edges. Do not remove adhesive strips completely unless your health care provider tells you to do that.  Check your insertion site every day for signs of infection. Check for: ? Redness, swelling, or pain. ? Fluid or blood. ? Pus or a bad smell. ? Warmth.  Do not take baths, swim, or use a hot tub until your health care provider approves.  You may shower 24-48 hours after the procedure, or as directed by your health care provider. ? Remove the dressing and gently wash the site with plain soap and water. ? Pat the area dry with a clean towel. ? Do not rub the site. That could cause bleeding.  Do not apply powder or lotion to the site. Activity   For 24 hours after the procedure, or as directed by your health care provider: ? Do not flex or bend the affected arm. ? Do not push or pull heavy objects with the affected arm. ? Do not  drive yourself home from the hospital or clinic. You may drive 24 hours after the procedure unless your health care provider tells you not to. ? Do not operate machinery or power tools.  Do not lift anything that is heavier than 10 lb (4.5 kg), or the limit that you are told, until your health care provider says that it is safe.  Ask your health care provider when it is okay to: ? Return to work or school. ? Resume usual physical activities or sports. ? Resume sexual activity. General instructions  If the catheter site starts to bleed, raise your arm and put firm pressure on the site. If the bleeding does not stop, get help right away. This is a medical emergency.  If you went home on the same day as your procedure, a responsible adult should be with you for the first 24 hours after you arrive home.  Keep all follow-up visits as told by your health care provider. This is important. Contact a health care provider if:  You have a fever.  You have redness, swelling, or yellow drainage around your insertion site. Get help right away if:  You have unusual pain at the radial site.  The catheter insertion area swells very fast.  The insertion area is bleeding, and the bleeding does not stop when you hold steady pressure on the area.  Your arm or hand becomes pale, cool, tingly, or numb. These symptoms may represent a serious problem   that is an emergency. Do not wait to see if the symptoms will go away. Get medical help right away. Call your local emergency services (911 in the [place] Do not drive yourself to the hospital. Summary  After the procedure, it is common to have bruising and tenderness at the site.  Follow instructions from your health care provider about how to take care of your radial site wound. Check the wound every day for signs of infection.  Do not lift anything that is heavier than 10 lb (4.5 kg), or the limit that you are told, until your health care provider says  that it is safe. This information is not intended to replace advice given to you by your health care provider. Make sure you discuss any questions you have with your health care provider. Document Revised: 01/14/2018 Document Reviewed: 01/14/2018 Elsevier Patient Education  2020 [employer]  

## 2020-10-02 NOTE — Progress Notes (Signed)
  Echocardiogram 2D Echocardiogram has been performed.  Jennette Dubin 10/02/2020, 1:57 PM

## 2020-10-02 NOTE — Telephone Encounter (Signed)
Patient's wife requesting to know how long the patient needs to stay out of work and a [employer] for his job.

## 2020-10-02 NOTE — Progress Notes (Signed)
Vascular in to do Echo

## 2020-10-03 ENCOUNTER — Encounter (HOSPITAL_COMMUNITY): Payer: Self-pay | Admitting: Cardiovascular Disease

## 2020-10-03 NOTE — Telephone Encounter (Signed)
How do you advise?

## 2020-10-03 NOTE — Telephone Encounter (Signed)
Spoke with the patients wife who states that the patient had an echo while at the hospital yesterday and was told to follow up with you. Is the 11/02/20 appt ok or do we need to see him sooner?

## 2020-10-03 NOTE — Telephone Encounter (Signed)
His chest was fine he can get back in the next day or 2 to work.

## 2020-10-03 NOTE — Telephone Encounter (Signed)
Recommendation as to keep current appointment was given to pts wife.

## 2020-10-03 NOTE — Telephone Encounter (Signed)
Echo has revealed moderate aortic stenosis.  Medical management.  That appointment is fine.

## 2020-10-06 ENCOUNTER — Telehealth: Payer: Self-pay | Admitting: Cardiology

## 2020-10-06 NOTE — Telephone Encounter (Signed)
This is a patient I referred to Dr Claiborne Billings for a heart cath. I will let him take a call on this. Generally I do not have any issues with this.

## 2020-10-06 NOTE — Telephone Encounter (Signed)
Patient would like to switch from Dr. Geraldo Pitter to Dr. Claiborne Billings. The patient and his wife would prefer to come to [facility] for future testing. Please let the patient know what the office decides

## 2020-10-09 ENCOUNTER — Telehealth: Payer: Self-pay | Admitting: Cardiovascular Disease

## 2020-10-09 NOTE — Telephone Encounter (Signed)
Patient's wife returning call for echo results.

## 2020-10-09 NOTE — Telephone Encounter (Signed)
Darren Sine, MD  10/05/2020 7:48 AM EDT     Normal LV function with EF 60 to 65%, mild LVH. Moderate mitral annular calcification. Moderate calcification of the aortic valve with moderate aortic stenosis; mean gradient 30, peak gradient 49.7. Mild dilation of ascending aorta 38 mm. No prior study to compare   The patients Wife Darren Lyons (on [place])  has been notified of the pts result and verbalized understanding.  All questions (if any) were answered. Nuala Alpha, LPN 03/75/4360 67:70 PM

## 2020-10-31 NOTE — Telephone Encounter (Signed)
    Pt's wife calling back to follow up pt's switch provider request

## 2020-11-02 ENCOUNTER — Ambulatory Visit: Payer: 59 | Admitting: Cardiology

## 2020-12-14 ENCOUNTER — Other Ambulatory Visit: Payer: Self-pay | Admitting: Physician Assistant

## 2021-03-19 ENCOUNTER — Other Ambulatory Visit: Payer: Self-pay

## 2021-03-19 ENCOUNTER — Ambulatory Visit: Payer: 59 | Admitting: Physician Assistant

## 2021-03-19 ENCOUNTER — Encounter: Payer: Self-pay | Admitting: Physician Assistant

## 2021-03-19 VITALS — BP 132/82 | HR 63 | Temp 97.5°F | Ht 66.0 in | Wt 171.0 lb

## 2021-03-19 DIAGNOSIS — K219 Gastro-esophageal reflux disease without esophagitis: Secondary | ICD-10-CM | POA: Diagnosis not present

## 2021-03-19 DIAGNOSIS — R432 Parageusia: Secondary | ICD-10-CM

## 2021-03-19 DIAGNOSIS — I10 Essential (primary) hypertension: Secondary | ICD-10-CM

## 2021-03-19 DIAGNOSIS — E782 Mixed hyperlipidemia: Secondary | ICD-10-CM

## 2021-03-19 DIAGNOSIS — R430 Anosmia: Secondary | ICD-10-CM

## 2021-03-19 MED ORDER — OMEPRAZOLE 40 MG PO CPDR: 40.0000 mg | DELAYED_RELEASE_CAPSULE | Freq: Every day | ORAL | 1 refills | Status: DC

## 2021-03-19 MED ORDER — ROSUVASTATIN CALCIUM 10 MG PO TABS: 10.0000 mg | ORAL_TABLET | Freq: Every day | ORAL | 1 refills | Status: DC

## 2021-03-19 MED ORDER — ATENOLOL 25 MG PO TABS: 25.0000 mg | ORAL_TABLET | Freq: Every day | ORAL | 1 refills | Status: DC

## 2021-03-19 NOTE — Addendum Note (Signed)
Addended byMarge Duncans on: 03/19/2021 10:50 AM   Modules accepted: Orders

## 2021-03-19 NOTE — Progress Notes (Signed)
Established Patient Office Visit  Subjective:  Patient ID: Darren Lyons, male    DOB: [birth year]  Age: 55 y.o. MRN: [id number]  CC:  Chief Complaint  Patient presents with  . Hypertension    56M Fasting    HPI Darren Lyons presents for hypertension  Pt presents for follow up of hypertension.  The patient is tolerating the medication well without side effects. Compliance with treatment has been fair; including taking medication as directed ,  Pt currently on tenormin 25mg  qd  Pt with history of hyperlipidemia - pt has now restarted his crestor 10mg  qd - due for labwork  Pt uses omeprazole for GERD - states works well for him  Pt states for the past 5 weeks he has had abnormal taste and smell - as far as he knows he did not have COVID - states certain foods and beverages he cannot tolerate- has not been able to eat meat since that time either - says it tastes rancid to him Certain smells in his home and perfumes also bad for him Past Medical History:  Diagnosis Date  . Essential hypertension 09/19/2020  . Hyperlipidemia   . Hypertension   . Migraine   . Mixed hyperlipidemia 09/19/2020  . Other chest pain 09/19/2020  . Other fatigue 09/19/2020  . Skin cancer of arm, right     Past Surgical History:  Procedure Laterality Date  . APPENDECTOMY    . LEFT HEART CATH AND CORONARY ANGIOGRAPHY N/A 10/02/2020   Procedure: LEFT HEART CATH AND CORONARY ANGIOGRAPHY;  Surgeon: Troy Sine, MD;  Location: [facility];  Service: Cardiovascular;  Laterality: N/A;  . RIGHT HEART CATH N/A 10/02/2020   Procedure: RIGHT HEART CATH;  Surgeon: Troy Sine, MD;  Location: [facility];  Service: Cardiovascular;  Laterality: N/A;    Family History  Problem Relation Age of Onset  . Diabetes Mother   . Hypertension Mother   . Cancer Father     Social History   Socioeconomic History  . Marital status: Unknown    Spouse name: Not on file  . Number of children: Not on file   . Years of education: Not on file  . Highest education level: Not on file  Occupational History  . Not on file  Tobacco Use  . Smoking status: Current Every Day Smoker    Packs/day: 1.00    Types: Cigarettes  . Smokeless tobacco: Never Used  Vaping Use  . Vaping Use: Never used  Substance and Sexual Activity  . Alcohol use: Never  . Drug use: Never  . Sexual activity: Not on file  Other Topics Concern  . Not on file  Social History Narrative  . Not on file   Social Determinants of Health   Financial Resource Strain: Not on file  Food Insecurity: Not on file  Transportation Needs: Not on file  Physical Activity: Not on file  Stress: Not on file  Social Connections: Not on file  Intimate Partner Violence: Not on file     Current Outpatient Medications:  .  aspirin EC 81 MG tablet, Take 1 tablet (81 mg total) by mouth daily. Swallow whole., Disp: 90 tablet, Rfl: 3 .  fluticasone (FLONASE) 50 MCG/ACT nasal spray, Place 2 sprays into both nostrils daily as needed for allergies or rhinitis., Disp: , Rfl:  .  ibuprofen (ADVIL) 800 MG tablet, Take 1 tablet (800 mg total) by mouth every 8 (eight) hours as needed. for pain, Disp:  270 tablet, Rfl: 1 .  atenolol (TENORMIN) 25 MG tablet, Take 1 tablet (25 mg total) by mouth daily., Disp: 90 tablet, Rfl: 1 .  nitroGLYCERIN (NITROSTAT) 0.4 MG SL tablet, Place 1 tablet (0.4 mg total) under the tongue every 5 (five) minutes as needed for chest pain., Disp: 90 tablet, Rfl: 3 .  omeprazole (PRILOSEC) 40 MG capsule, Take 1 capsule (40 mg total) by mouth daily., Disp: 90 capsule, Rfl: 1 .  rosuvastatin (CRESTOR) 10 MG tablet, Take 1 tablet (10 mg total) by mouth daily., Disp: 90 tablet, Rfl: 1   Allergies  Allergen Reactions  . Codeine Rash  . Pseudoephedrine Hcl Anxiety  . Zyrtec [Cetirizine] Anxiety    ROS CONSTITUTIONAL: Negative for chills, fatigue, fever, unintentional weight gain and unintentional weight loss.  E/N/T: see  HPI CARDIOVASCULAR: negative for chest pain RESPIRATORY: Negative for recent cough and dyspnea.  GASTROINTESTINAL: Negative for abdominal pain, acid reflux symptoms, constipation, diarrhea, nausea and vomiting.  INTEGUMENTARY: Negative for rash.  NEUROLOGICAL: Negative for dizziness and headaches.  PSYCHIATRIC: Negative for sleep disturbance and to question depression screen.  Negative for depression, negative for anhedonia.        Objective:    PHYSICAL EXAM:   VS: BP 132/82 (BP Location: Right Arm, Patient Position: Sitting, Cuff Size: Normal)   Pulse 63   Temp (!) 97.5 F (36.4 C) (Temporal)   Ht 5\' 6"  (1.676 m)   Wt 171 lb (77.6 kg)   SpO2 96%   BMI 27.60 kg/m   PHYSICAL EXAM:   VS: BP 132/82 (BP Location: Right Arm, Patient Position: Sitting, Cuff Size: Normal)   Pulse 63   Temp (!) 97.5 F (36.4 C) (Temporal)   Ht 5\' 6"  (1.676 m)   Wt 171 lb (77.6 kg)   SpO2 96%   BMI 27.60 kg/m   GEN: Well nourished, well developed, in no acute distress  HEENT: normal external ears and nose - normal external auditory canals and TMS -- Lips, Teeth and Gums - normal  Oropharynx - normal mucosa, palate, and posterior pharynx- tongue slightly coated but does drink coffee and smokes Neck: no JVD or masses - no thyromegaly Cardiac: RRR; no murmurs, rubs, or gallops,no edema - \ Respiratory:  normal respiratory rate and pattern with no distress - normal breath sounds with no rales, rhonchi, wheezes or rubs Skin: warm and dry, no rash  Neuro:  Alert and Oriented x 3, Strength and sensation are intact - CN II-Xii grossly intact Psych: euthymic mood, appropriate affect and demeanor   BP 132/82 (BP Location: Right Arm, Patient Position: Sitting, Cuff Size: Normal)   Pulse 63   Temp (!) 97.5 F (36.4 C) (Temporal)   Ht 5\' 6"  (1.676 m)   Wt 171 lb (77.6 kg)   SpO2 96%   BMI 27.60 kg/m  Wt Readings from Last 3 Encounters:  03/19/21 171 lb (77.6 kg)  10/02/20 188 lb (85.3 kg)   09/29/20 188 lb 6.4 oz (85.5 kg)   ekg - t wave changes - no acute  There are no preventive care reminders to display for this patient.  There are no preventive care reminders to display for this patient.  Lab Results  Component Value Date   TSH 2.210 09/19/2020   Lab Results  Component Value Date   WBC 7.9 09/19/2020   HGB 16.0 10/02/2020   HCT 47.0 10/02/2020   MCV 87 09/19/2020   PLT 181 09/19/2020   Lab Results  Component Value Date  NA 139 10/02/2020   K 4.4 10/02/2020   CO2 25 09/19/2020   GLUCOSE 91 09/19/2020   BUN 15 09/19/2020   CREATININE 1.01 09/19/2020   BILITOT 0.6 09/19/2020   ALKPHOS 96 09/19/2020   AST 19 09/19/2020   ALT 20 09/19/2020   PROT 6.8 09/19/2020   ALBUMIN 4.8 09/19/2020   CALCIUM 9.6 09/19/2020   Lab Results  Component Value Date   CHOL 241 (H) 09/19/2020   Lab Results  Component Value Date   HDL 31 (L) 09/19/2020   Lab Results  Component Value Date   LDLCALC 163 (H) 09/19/2020   Lab Results  Component Value Date   TRIG 249 (H) 09/19/2020   Lab Results  Component Value Date   CHOLHDL 7.8 (H) 09/19/2020   No results found for: HGBA1C    Assessment & Plan:   Problem List Items Addressed This Visit      Cardiovascular and Mediastinum   Essential hypertension - Primary   Relevant Medications   atenolol (TENORMIN) 25 MG tablet   rosuvastatin (CRESTOR) 10 MG tablet   Other Relevant Orders   CBC with Differential/Platelet   Comprehensive metabolic panel   TSH     Other   Mixed hyperlipidemia   Relevant Medications   atenolol (TENORMIN) 25 MG tablet   rosuvastatin (CRESTOR) 10 MG tablet   Other Relevant Orders   Lipid panel    Other Visit Diagnoses    Loss of taste       Relevant Orders   SARS-CoV-2 Antibody, IgM Recommend also a CT for further evaluation but pt opts to wait at this time - if symptoms persist will get the head CT   Gastroesophageal reflux disease without esophagitis       Relevant  Medications   omeprazole (PRILOSEC) 40 MG capsule      Meds ordered this encounter  Medications  . atenolol (TENORMIN) 25 MG tablet    Sig: Take 1 tablet (25 mg total) by mouth daily.    Dispense:  90 tablet    Refill:  1    Order Specific Question:   Supervising Provider    AnswerRochel Brome [id number]  . omeprazole (PRILOSEC) 40 MG capsule    Sig: Take 1 capsule (40 mg total) by mouth daily.    Dispense:  90 capsule    Refill:  1    Order Specific Question:   Supervising Provider    AnswerRochel Brome [id number]  . rosuvastatin (CRESTOR) 10 MG tablet    Sig: Take 1 tablet (10 mg total) by mouth daily.    Dispense:  90 tablet    Refill:  1    Order Specific Question:   Supervising Provider    Answer:   Shelton Silvas    Follow-up: Return in about 6 months (around 09/19/2021) for chronic fasting follow up.    SARA R Karsen Fellows, PA-C

## 2021-03-20 LAB — CBC WITH DIFFERENTIAL/PLATELET
Basophils Absolute: 0.1 10*3/uL (ref 0.0–0.2)
Basos: 1 %
EOS (ABSOLUTE): 0.2 10*3/uL (ref 0.0–0.4)
Eos: 2 %
Hematocrit: 51.1 % — ABNORMAL HIGH (ref 37.5–51.0)
Hemoglobin: 17 g/dL (ref 13.0–17.7)
Immature Grans (Abs): 0 10*3/uL (ref 0.0–0.1)
Immature Granulocytes: 0 %
Lymphocytes Absolute: 2.9 10*3/uL (ref 0.7–3.1)
Lymphs: 39 %
MCH: 28.8 pg (ref 26.6–33.0)
MCHC: 33.3 g/dL (ref 31.5–35.7)
MCV: 87 fL (ref 79–97)
Monocytes Absolute: 0.4 10*3/uL (ref 0.1–0.9)
Monocytes: 5 %
Neutrophils Absolute: 3.9 10*3/uL (ref 1.4–7.0)
Neutrophils: 53 %
Platelets: 186 10*3/uL (ref 150–450)
RBC: 5.9 x10E6/uL — ABNORMAL HIGH (ref 4.14–5.80)
RDW: 13.6 % (ref 11.6–15.4)
WBC: 7.5 10*3/uL (ref 3.4–10.8)

## 2021-03-20 LAB — COMPREHENSIVE METABOLIC PANEL
ALT: 15 IU/L (ref 0–44)
AST: 20 IU/L (ref 0–40)
Albumin/Globulin Ratio: 2.7 — ABNORMAL HIGH (ref 1.2–2.2)
Albumin: 4.8 g/dL (ref 3.8–4.9)
Alkaline Phosphatase: 91 IU/L (ref 44–121)
BUN/Creatinine Ratio: 18 (ref 9–20)
BUN: 16 mg/dL (ref 6–24)
Bilirubin Total: 0.4 mg/dL (ref 0.0–1.2)
CO2: 21 mmol/L (ref 20–29)
Calcium: 9.5 mg/dL (ref 8.7–10.2)
Chloride: 103 mmol/L (ref 96–106)
Creatinine, Ser: 0.88 mg/dL (ref 0.76–1.27)
Globulin, Total: 1.8 g/dL (ref 1.5–4.5)
Glucose: 93 mg/dL (ref 65–99)
Potassium: 4.7 mmol/L (ref 3.5–5.2)
Sodium: 140 mmol/L (ref 134–144)
Total Protein: 6.6 g/dL (ref 6.0–8.5)
eGFR: 100 mL/min/{1.73_m2} (ref >59)

## 2021-03-20 LAB — LIPID PANEL
Chol/HDL Ratio: 4.3 ratio (ref 0.0–5.0)
Cholesterol, Total: 146 mg/dL (ref 100–199)
HDL: 34 mg/dL — ABNORMAL LOW (ref >39)
LDL Chol Calc (NIH): 81 mg/dL (ref 0–99)
Triglycerides: 181 mg/dL — ABNORMAL HIGH (ref 0–149)
VLDL Cholesterol Cal: 31 mg/dL (ref 5–40)

## 2021-03-20 LAB — CARDIOVASCULAR RISK ASSESSMENT

## 2021-03-20 LAB — TSH: TSH: 3.82 u[IU]/mL (ref 0.450–4.500)

## 2021-03-20 LAB — SAR COV2 SEROLOGY (COVID19)AB(IGG),IA
SARS-CoV-2 Semi-Quant IgG Ab: 16.4 AU/mL (ref <13.0)
SARS-CoV-2 Spike Ab Interp: POSITIVE

## 2021-06-14 ENCOUNTER — Other Ambulatory Visit: Payer: Self-pay | Admitting: Physician Assistant

## 2021-09-03 ENCOUNTER — Encounter: Payer: Self-pay | Admitting: Physician Assistant

## 2021-09-03 ENCOUNTER — Other Ambulatory Visit: Payer: Self-pay

## 2021-09-03 ENCOUNTER — Ambulatory Visit: Payer: 59 | Admitting: Physician Assistant

## 2021-09-03 VITALS — BP 110/78 | HR 66 | Temp 96.4°F | Ht 66.0 in | Wt 157.6 lb

## 2021-09-03 DIAGNOSIS — I10 Essential (primary) hypertension: Secondary | ICD-10-CM

## 2021-09-03 DIAGNOSIS — F320 Major depressive disorder, single episode, mild: Secondary | ICD-10-CM

## 2021-09-03 DIAGNOSIS — R634 Abnormal weight loss: Secondary | ICD-10-CM

## 2021-09-03 DIAGNOSIS — F17210 Nicotine dependence, cigarettes, uncomplicated: Secondary | ICD-10-CM | POA: Diagnosis not present

## 2021-09-03 DIAGNOSIS — R072 Precordial pain: Secondary | ICD-10-CM | POA: Diagnosis not present

## 2021-09-03 DIAGNOSIS — I350 Nonrheumatic aortic (valve) stenosis: Secondary | ICD-10-CM

## 2021-09-03 DIAGNOSIS — E782 Mixed hyperlipidemia: Secondary | ICD-10-CM | POA: Diagnosis not present

## 2021-09-03 DIAGNOSIS — Z125 Encounter for screening for malignant neoplasm of prostate: Secondary | ICD-10-CM

## 2021-09-03 DIAGNOSIS — Z1211 Encounter for screening for malignant neoplasm of colon: Secondary | ICD-10-CM

## 2021-09-03 MED ORDER — PAROXETINE HCL 10 MG PO TABS: 10.0000 mg | ORAL_TABLET | Freq: Every day | ORAL | 2 refills | Status: DC

## 2021-09-03 NOTE — Progress Notes (Signed)
Subjective:  Patient ID: Darren Lyons, male    DOB: 21-Jun-1966  Age: 55 y.o. MRN: [id number]  Chief Complaint  Patient presents with   Hypertension    HPI  Pt presents for follow up of hypertension. The patient is tolerating the medication well without side effects. Compliance with treatment has been good; including taking medication as directed , maintains a healthy diet and regular exercise regimen - pt currently taking tenormin '25mg'$   Mixed hyperlipidemia  Pt presents with hyperlipidemia.  The patient is compliant with medications, maintains a low cholesterol diet , follows up as directed ,  The patient denies experiencing any hypercholesterolemia related symptoms. He is currently taking crestor '10mg'$  qd  Pt has had significant weight loss over the past year - he has lost a total of 30 pounds in the past year and has lost 15 pounds in the past 6 months - he thinks it was due to having COVID and stating things do not taste well to him so he has not been eating. (However weight loss started before this) Says he is trying to supplement diet with dairy, boost supplements etc and trying to stay at 2000 calories daily Pt states he is hypersensitive to smell and taste Pt denies abdominal pain, nausea, vomiting, diarrhea or constipation - no melena or hematochezia  Pt states that he has had some atypical chest pain - he thinks due to his smoking because he has increased to over 2ppd --- he has a 40 plus year smoking history  Pt did see cardiology in fall of 2021 - had a cath which he states was normal and records show he had an echo which showed moderate aortic stenosis - he was supposed to follow up in 11/21 with cardiology but never went for follow up -- he had requested to see Dr Claiborne Billings and pt did not follow through to schedule appt  Pt states that he has had trouble with depression intermittently 'all his life' - has not been on medication but agreeable to try --- states for the past several  weeks 'it got bad' and did not feel like doing anything he normally did and despite being hungry he just won't eat when feeling depressed Pt states he feels 'fine' now but worried symptoms might recur   Current Outpatient Medications on File Prior to Visit  Medication Sig Dispense Refill   aspirin EC 81 MG tablet Take 1 tablet (81 mg total) by mouth daily. Swallow whole. 90 tablet 3   atenolol (TENORMIN) 25 MG tablet Take 1 tablet (25 mg total) by mouth daily. 90 tablet 1   fluticasone (FLONASE) 50 MCG/ACT nasal spray Place 2 sprays into both nostrils daily as needed for allergies or rhinitis.     ibuprofen (ADVIL) 800 MG tablet TAKE 1 TABLET BY MOUTH EVERY 8 HOURS AS NEEDED FOR PAIN 270 tablet 0   omeprazole (PRILOSEC) 40 MG capsule Take 1 capsule (40 mg total) by mouth daily. 90 capsule 1   rosuvastatin (CRESTOR) 10 MG tablet Take 1 tablet (10 mg total) by mouth daily. 90 tablet 1   nitroGLYCERIN (NITROSTAT) 0.4 MG SL tablet Place 1 tablet (0.4 mg total) under the tongue every 5 (five) minutes as needed for chest pain. 90 tablet 3   No current facility-administered medications on file prior to visit.   Past Medical History:  Diagnosis Date   Essential hypertension 09/19/2020   Hyperlipidemia    Hypertension    Migraine    Mixed hyperlipidemia 09/19/2020  Other chest pain 09/19/2020   Other fatigue 09/19/2020   Skin cancer of arm, right    Past Surgical History:  Procedure Laterality Date   APPENDECTOMY     LEFT HEART CATH AND CORONARY ANGIOGRAPHY N/A 10/02/2020   Procedure: LEFT HEART CATH AND CORONARY ANGIOGRAPHY;  Surgeon: Troy Sine, MD;  Location: [facility];  Service: Cardiovascular;  Laterality: N/A;   RIGHT HEART CATH N/A 10/02/2020   Procedure: RIGHT HEART CATH;  Surgeon: Troy Sine, MD;  Location: [facility];  Service: Cardiovascular;  Laterality: N/A;    Family History  Problem Relation Age of Onset   Diabetes Mother    Hypertension Mother     Cancer Father    Social History   Socioeconomic History   Marital status: Unknown    Spouse name: Not on file   Number of children: Not on file   Years of education: Not on file   Highest education level: Not on file  Occupational History   Not on file  Tobacco Use   Smoking status: Every Day    Packs/day: 1.00    Types: Cigarettes   Smokeless tobacco: Never  Vaping Use   Vaping Use: Never used  Substance and Sexual Activity   Alcohol use: Never   Drug use: Never   Sexual activity: Not on file  Other Topics Concern   Not on file  Social History Narrative   Not on file   Social Determinants of Health   Financial Resource Strain: Not on file  Food Insecurity: Not on file  Transportation Needs: Not on file  Physical Activity: Not on file  Stress: Not on file  Social Connections: Not on file    Review of Systems CONSTITUTIONAL: see HPI E/N/T: Negative for ear pain, nasal congestion and sore throat.  CARDIOVASCULAR: see HPI RESPIRATORY: Negative for recent cough and dyspnea.  GASTROINTESTINAL: Negative for abdominal pain, acid reflux symptoms, constipation, diarrhea, nausea and vomiting.  MSK: Negative for arthralgias and myalgias.  INTEGUMENTARY: Negative for rash.  NEUROLOGICAL: Negative for dizziness and headaches.  PSYCHIATRIC: see HPI      Objective:  PHYSICAL EXAM:   VS: BP 110/78 (BP Location: Left Arm, Patient Position: Sitting, Cuff Size: Normal)   Pulse 66   Temp (!) 96.4 F (35.8 C) (Temporal)   Ht '5\' 6"'$  (1.676 m)   Wt 157 lb 9.6 oz (71.5 kg)   SpO2 97%   BMI 25.44 kg/m   GEN: Well nourished, well developed, in no acute distress  HEENT: normal external ears and nose - normal external auditory canals and TMS - - Lips, Teeth and Gums - normal  Oropharynx - normal mucosa, palate, and posterior pharynx Neck: no JVD or masses - no thyromegaly Cardiac: RRR; no murmurs, rubs, or gallops,no edema -  Respiratory:  normal respiratory rate and pattern  with no distress - normal breath sounds with no rales, rhonchi, wheezes or rubs GI: normal bowel sounds, no masses or tenderness Skin: warm and dry, no rash  Psych: euthymic mood, appropriate affect and demeanor  EKG normal Diabetic Foot Exam - Simple   No data filed      Lab Results  Component Value Date   WBC 7.5 03/19/2021   HGB 17.0 03/19/2021   HCT 51.1 (H) 03/19/2021   PLT 186 03/19/2021   GLUCOSE 93 03/19/2021   CHOL 146 03/19/2021   TRIG 181 (H) 03/19/2021   HDL 34 (L) 03/19/2021   LDLCALC 81 03/19/2021  ALT 15 03/19/2021   AST 20 03/19/2021   NA 140 03/19/2021   K 4.7 03/19/2021   CL 103 03/19/2021   CREATININE 0.88 03/19/2021   BUN 16 03/19/2021   CO2 21 03/19/2021   TSH 3.820 03/19/2021      Assessment & Plan:   Problem List Items Addressed This Visit       Cardiovascular and Mediastinum   Essential hypertension   Relevant Orders   CBC with Differential/Platelet   Comprehensive metabolic panel Continue current meds   Ambulatory referral to Cardiology   Aortic valve stenosis   Relevant Orders   Ambulatory referral to Cardiology     Other   Mixed hyperlipidemia   Relevant Orders   Lipid panel Continue meds Watch diet   Ambulatory referral to Cardiology   Cigarette smoker   Relevant Orders   CT CHEST LUNG CA SCREEN LOW DOSE W/O CM Recommend stop smoking   Other Visit Diagnoses     Precordial pain    -  Primary   Relevant Orders   EKG 12-Lead Follow up with cardiologist   Weight loss       Relevant Orders   CBC with Differential/Platelet   Comprehensive metabolic panel   TSH   Ambulatory referral to Gastroenterology   Prostate cancer screening       Relevant Orders   PSA   Colon cancer screening       Relevant Orders   Ambulatory referral to Gastroenterology   Depression, major, single episode, mild (HCC)       Relevant Medications   PARoxetine (PAXIL) 10 MG tablet     .  Meds ordered this encounter  Medications    PARoxetine (PAXIL) 10 MG tablet    Sig: Take 1 tablet (10 mg total) by mouth daily.    Dispense:  30 tablet    Refill:  2    Order Specific Question:   Supervising Provider    AnswerRochel Brome [phone]    Orders Placed This Encounter  Procedures   CT CHEST LUNG CA SCREEN LOW DOSE W/O CM   CBC with Differential/Platelet   Comprehensive metabolic panel   TSH   Lipid panel   PSA   Ambulatory referral to Gastroenterology   Ambulatory referral to Cardiology   EKG 12-Lead     Follow-up: Return in about 4 weeks (around 10/01/2021) for follow up.  An After Visit Summary was printed and given to the patient.  Yetta Flock [facility] [phone]

## 2021-09-04 ENCOUNTER — Telehealth: Payer: Self-pay | Admitting: Physician Assistant

## 2021-09-04 LAB — COMPREHENSIVE METABOLIC PANEL
ALT: 12 IU/L (ref 0–44)
AST: 15 IU/L (ref 0–40)
Albumin/Globulin Ratio: 2.9 — ABNORMAL HIGH (ref 1.2–2.2)
Albumin: 4.9 g/dL (ref 3.8–4.9)
Alkaline Phosphatase: 75 IU/L (ref 44–121)
BUN/Creatinine Ratio: 18 (ref 9–20)
BUN: 15 mg/dL (ref 6–24)
Bilirubin Total: 0.5 mg/dL (ref 0.0–1.2)
CO2: 23 mmol/L (ref 20–29)
Calcium: 9.6 mg/dL (ref 8.7–10.2)
Chloride: 100 mmol/L (ref 96–106)
Creatinine, Ser: 0.83 mg/dL (ref 0.76–1.27)
Globulin, Total: 1.7 g/dL (ref 1.5–4.5)
Glucose: 98 mg/dL (ref 65–99)
Potassium: 4.5 mmol/L (ref 3.5–5.2)
Sodium: 139 mmol/L (ref 134–144)
Total Protein: 6.6 g/dL (ref 6.0–8.5)
eGFR: 102 mL/min/{1.73_m2} (ref >59)

## 2021-09-04 LAB — LIPID PANEL
Chol/HDL Ratio: 3.4 ratio (ref 0.0–5.0)
Cholesterol, Total: 125 mg/dL (ref 100–199)
HDL: 37 mg/dL — ABNORMAL LOW (ref >39)
LDL Chol Calc (NIH): 66 mg/dL (ref 0–99)
Triglycerides: 123 mg/dL (ref 0–149)
VLDL Cholesterol Cal: 22 mg/dL (ref 5–40)

## 2021-09-04 LAB — CBC WITH DIFFERENTIAL/PLATELET
Basophils Absolute: 0.1 10*3/uL (ref 0.0–0.2)
Basos: 1 %
EOS (ABSOLUTE): 0.1 10*3/uL (ref 0.0–0.4)
Eos: 1 %
Hematocrit: 48.4 % (ref 37.5–51.0)
Hemoglobin: 16.7 g/dL (ref 13.0–17.7)
Immature Grans (Abs): 0 10*3/uL (ref 0.0–0.1)
Immature Granulocytes: 0 %
Lymphocytes Absolute: 2.7 10*3/uL (ref 0.7–3.1)
Lymphs: 27 %
MCH: 30.5 pg (ref 26.6–33.0)
MCHC: 34.5 g/dL (ref 31.5–35.7)
MCV: 89 fL (ref 79–97)
Monocytes Absolute: 0.5 10*3/uL (ref 0.1–0.9)
Monocytes: 5 %
Neutrophils Absolute: 6.9 10*3/uL (ref 1.4–7.0)
Neutrophils: 66 %
Platelets: 208 10*3/uL (ref 150–450)
RBC: 5.47 x10E6/uL (ref 4.14–5.80)
RDW: 12.7 % (ref 11.6–15.4)
WBC: 10.3 10*3/uL (ref 3.4–10.8)

## 2021-09-04 LAB — CARDIOVASCULAR RISK ASSESSMENT

## 2021-09-04 LAB — PSA: Prostate Specific Ag, Serum: 0.5 ng/mL (ref 0.0–4.0)

## 2021-09-04 LAB — TSH: TSH: 2.86 u[IU]/mL (ref 0.450–4.500)

## 2021-09-04 NOTE — Telephone Encounter (Signed)
   Darren Lyons has been scheduled for the following appointment:  WHAT: CT LUNG SCREEN WHERE: [facility] DATE: 09/18/21 TIME: 1:30 PM ARRIVAL TIME  Patient has been made aware.

## 2021-09-06 ENCOUNTER — Encounter: Payer: Self-pay | Admitting: Physician Assistant

## 2021-09-10 ENCOUNTER — Other Ambulatory Visit: Payer: Self-pay | Admitting: Physician Assistant

## 2021-09-10 DIAGNOSIS — I10 Essential (primary) hypertension: Secondary | ICD-10-CM

## 2021-09-10 DIAGNOSIS — E782 Mixed hyperlipidemia: Secondary | ICD-10-CM

## 2021-09-10 DIAGNOSIS — K219 Gastro-esophageal reflux disease without esophagitis: Secondary | ICD-10-CM

## 2021-09-19 ENCOUNTER — Telehealth: Payer: Self-pay

## 2021-09-19 NOTE — Telephone Encounter (Signed)
Attempted to call patient unable to leave message.  Per Gay Filler, CT Lung Screening results are as follows:  Small solid pulmonary nodule of right upper lobe, with benign appearance. Recommend to repeat in 12 months. Aortic valve calcifications noted. Needs to address with Cardiologist at 09/2021 appointment.

## 2021-10-01 ENCOUNTER — Other Ambulatory Visit: Payer: Self-pay

## 2021-10-01 ENCOUNTER — Ambulatory Visit: Payer: 59 | Admitting: Physician Assistant

## 2021-10-05 ENCOUNTER — Other Ambulatory Visit: Payer: Self-pay

## 2021-10-05 ENCOUNTER — Encounter (HOSPITAL_BASED_OUTPATIENT_CLINIC_OR_DEPARTMENT_OTHER): Payer: Self-pay | Admitting: Family

## 2021-10-05 ENCOUNTER — Ambulatory Visit (HOSPITAL_BASED_OUTPATIENT_CLINIC_OR_DEPARTMENT_OTHER): Payer: 59 | Admitting: Family

## 2021-10-05 VITALS — BP 164/92 | HR 64 | Ht 66.0 in | Wt 160.0 lb

## 2021-10-05 DIAGNOSIS — E785 Hyperlipidemia, unspecified: Secondary | ICD-10-CM | POA: Diagnosis not present

## 2021-10-05 DIAGNOSIS — I25118 Atherosclerotic heart disease of native coronary artery with other forms of angina pectoris: Secondary | ICD-10-CM | POA: Diagnosis not present

## 2021-10-05 DIAGNOSIS — I10 Essential (primary) hypertension: Secondary | ICD-10-CM | POA: Diagnosis not present

## 2021-10-05 DIAGNOSIS — I77810 Thoracic aortic ectasia: Secondary | ICD-10-CM

## 2021-10-05 DIAGNOSIS — Z720 Tobacco use: Secondary | ICD-10-CM

## 2021-10-05 DIAGNOSIS — I350 Nonrheumatic aortic (valve) stenosis: Secondary | ICD-10-CM

## 2021-10-05 DIAGNOSIS — R002 Palpitations: Secondary | ICD-10-CM

## 2021-10-05 MED ORDER — ATENOLOL 50 MG PO TABS: 50.0000 mg | ORAL_TABLET | Freq: Every day | ORAL | 1 refills | Status: DC

## 2021-10-05 NOTE — Progress Notes (Signed)
Office Visit    Patient Name: Darren Lyons Date of Encounter: 10/06/2021  PCP:  Davis, Sara, [place]  Cardiologist:  Shelva Majestic, MD  Advanced Practice Provider:  No care team member to display Electrophysiologist:  None      Chief Complaint    Darren Lyons is a 55 y.o. male with a hx of  HTN, CAD, HLD, aortic stenosis  presents today for hypertension follow up   Past Medical History    Past Medical History:  Diagnosis Date   Essential hypertension 09/19/2020   Hyperlipidemia    Hypertension    Migraine    Mixed hyperlipidemia 09/19/2020   Other chest pain 09/19/2020   Other fatigue 09/19/2020   Skin cancer of arm, right    Past Surgical History:  Procedure Laterality Date   APPENDECTOMY     LEFT HEART CATH AND CORONARY ANGIOGRAPHY N/A 10/02/2020   Procedure: LEFT HEART CATH AND CORONARY ANGIOGRAPHY;  Surgeon: Troy Sine, MD;  Location: [facility];  Service: Cardiovascular;  Laterality: N/A;   RIGHT HEART CATH N/A 10/02/2020   Procedure: RIGHT HEART CATH;  Surgeon: Troy Sine, MD;  Location: [facility];  Service: Cardiovascular;  Laterality: N/A;    Allergies  Allergies  Allergen Reactions   Codeine Rash   Pseudoephedrine Hcl Anxiety   Zyrtec [Cetirizine] Anxiety    History of Present Illness    Darren Lyons is a 55 y.o. male with a hx of HTN, CAD, HLD, aortic stenosis last seen for cardiac cath 10/02/20.  Previously evaluated by Dr. Geraldo Pitter for exertional dyspnea. Subsequent testing included LHC 10/02/20 with mild nonobstructive coronary disease with 20% lesions to 2nd marginal, RCA, LAD. Echo 10/02/20 with LVEF 60-65%, LCH, moderate mitral annular calcification, moderate aortic calcification with moderate aortic stenosis, mild dilation of ascending aorta 68mm. Patient has since switched to Dr. Claiborne Billings.   He presents today for follow up. At follow up with primary one month ago he noted some chest  tightness. Reports palpitations especially at night. Worsening over the last few months. Not associated with chest pain nor shortness of breath. No lightheadedness, dizziness, near syncope, nor syncope. 09/03/21 TSH 2.86, BMP and CBC at that time unremarkable. Drinks coffee and water throughout the day. Drinks 3/4 of pot of coffee per day. Tries to drink one bottle of water throughout the day. 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 Plays disc golf and golf. He has quit smoking cigarettes as well as marijuana for 1 month. Reports less chest tightness since stopping. He has lost 30 pounds over the last year by making lifestyle changes.   EKGs/Labs/Other Studies Reviewed:   The following studies were reviewed today:  Echo 10/02/20 1. Left ventricular ejection fraction, by estimation, is 60 to 65%. The  left ventricle has normal function. The left ventricle has no regional  wall motion abnormalities. There is mild concentric left ventricular  hypertrophy. Left ventricular diastolic  parameters are indeterminate.   2. Right ventricular systolic function is normal. The right ventricular  size is normal. Tricuspid regurgitation signal is inadequate for assessing  PA pressure.   3. The mitral valve is normal in structure. Trivial mitral valve  regurgitation. No evidence of mitral stenosis. Moderate mitral annular  calcification.   4. The aortic valve is calcified. There is moderate calcification of the  aortic valve. There is moderate thickening of the aortic valve. Aortic  valve regurgitation is not visualized. Moderate aortic valve stenosis.   5. Aortic dilatation noted. There is mild dilatation of the ascending  aorta, measuring 38 mm.   6. The inferior vena cava is normal in size with greater than 50%  respiratory variability, suggesting right atrial pressure of 3 mmHg.   LHC 10/02/20 2nd Mrg-1 lesion is 20% stenosed. 2nd Mrg-2 lesion is 20% stenosed. Mid RCA  lesion is 20% stenosed. Prox LAD to Mid LAD lesion is 20% stenosed. The left ventricular systolic function is normal. LV end diastolic pressure is normal. The left ventricular ejection fraction is greater than 65% by visual estimate.   Mild nonobstructive CAD with common left main ostium with 20% proximal LAD narrowing before the first diagonal vessel; 20% circumflex stenoses; and 20% mid RCA stenosis in a dominant RCA supplying the entire posterior wall to the apex.   Systemic hypertension,   Minimally elevated right heart pressures with mild pulmonary hypertension with a PA pressure mean at 26 mmHg.   Severe aortic valve stenosis with a mean gradient of 42.7 mmHg and a valve area of 0.6 cm.   RECOMMENDATION: I have ordered an echo Doppler study to be done following the patient's cardiac catheterization.  Cardiac auscultation reveals at least a 2/6 mid peaking systolic murmur in the aortic position consistent with his aortic stenosis.  Plan is for discharge later today.  He will follow up with Dr. Geraldo Pitter.  I suspect the patient's recent increasing symptomatology is secondary to his aortic valve stenosis rather than coronary obstructive disease.   EKG: No EKG is  ordered today.  The ekg independently reviewed from 09/03/21 demonstrated NSR 62 bpm with no acute ST/T wave changes.   Recent Labs: 09/03/2021: ALT 12; BUN 15; Creatinine, Ser 0.83; Hemoglobin 16.7; Platelets 208; Potassium 4.5; Sodium 139; TSH 2.860  Recent Lipid Panel    Component Value Date/Time   CHOL 125 09/03/2021 0853   TRIG 123 09/03/2021 0853   HDL 37 (L) 09/03/2021 0853   CHOLHDL 3.4 09/03/2021 0853   LDLCALC 66 09/03/2021 0853    Home Medications   Current Meds  Medication Sig   aspirin EC 81 MG tablet Take 1 tablet (81 mg total) by mouth daily. Swallow whole.   atenolol (TENORMIN) 50 MG tablet Take 1 tablet (50 mg total) by mouth daily.   fluticasone (FLONASE) 50 MCG/ACT nasal spray Place 2 sprays into both  nostrils daily as needed for allergies or rhinitis.   ibuprofen (ADVIL) 800 MG tablet TAKE 1 TABLET BY MOUTH EVERY 8 HOURS AS NEEDED FOR PAIN   omeprazole (PRILOSEC) 40 MG capsule Take 1 capsule by mouth once daily   PARoxetine (PAXIL) 10 MG tablet Take 1 tablet (10 mg total) by mouth daily.   rosuvastatin (CRESTOR) 10 MG tablet Take 1 tablet by mouth once daily   [DISCONTINUED] atenolol (TENORMIN) 25 MG tablet Take 1 tablet by mouth once daily     Review of Systems      All other systems reviewed and are otherwise negative except as noted above.  Physical Exam    VS:  BP (!) 164/92   Pulse 64   Ht 5\' 6"  (1.676 m)   Wt 160 lb (72.6 kg)   SpO2 94%   BMI 25.82 kg/m  , BMI Body mass index is 25.82 kg/m.  Wt Readings from Last 3 Encounters:  10/05/21 160 lb (72.6 kg)  09/03/21 157 lb 9.6 oz (71.5 kg)  03/19/21 171 lb (77.6 kg)  GEN: Well nourished, well developed, in no acute distress. HEENT: normal. Neck: Supple, no JVD, carotid bruits, or masses. Cardiac: RRR, no rubs, or gallops. Gr 6-8/0 systolic murmur. No clubbing, cyanosis, edema.  Radials/PT 2+ and equal bilaterally.  Respiratory:  Respirations regular and unlabored, clear to auscultation bilaterally. GI: Soft, nontender, nondistended. MS: No deformity or atrophy. Skin: Warm and dry, no rash. Neuro:  Strength and sensation are intact. Psych: Normal affect.  Assessment & Plan    Palpitations - Reports worsening palpitations over last few months. Most notable at night. Recent TSH, electrolytes CBC without acute abnormality. Known moderate aortic stenosis, as detailed below. Only drinking one bottle of water per day and encouraged to increase hydration. Not taking proarrhythmic agents. Prefers to start with medication changes, change Atenolol to 50mg  QD. Will defer transition to alternate BB as he has had good migraine prophylaxis with Atenolol though could be considered in the future. If persistent palpitations, plan for  ZIO at follow up.   HTN - BP markedly elevated today in clinic though well controlled at recent provider visit. He does endorse being nervous today. He will purchase a home blood pressure cuff for monitoring. If BP consistently <130/80 plan for addition of antihypertensive agent such as ARB.   Depression - Notes some improvement with Paxil. Continue to follow with PCP.   HLD, LDL goal <70 - 09/03/21 LDL 66. Continue Rosuvastatin 10mg  QD. Denies myalgias.   Nonobstructive CAD - Stable with no anginal symptoms. No indication for ischemic evaluation.  EKG 08/2021 with no ST/T wave changes. [facility] 09/2020 with scattered 20% stenosis. GDMT includes Atenolol, Rosuvastatin, Aspirin. Heart healthy diet and regular cardiovascular exercise encouraged.    Moderate aortic stenosis - 09/2020 echo with normal LVEF, mild LVH, moderate aortic stenosis. Gr 2-3/6 murmur by exam today. No syncope. Chest pain resolved since quitting smoking. Update echo for monitoring.   Tobacco use - Congratulated on one month of cessation. He is very motivated to quit.  Recommend utilization of 1800QUITNOW.   Dilation ascending aorta - 09/2020 measured 44mm by echo. Repeat echo for monitoring. Continue optimal BP control.   Disposition: Follow up in 2-3 month(s) with Dr. Claiborne Billings or APP.  Signed, Loel Dubonnet, NP 10/06/2021, 7:45 AM [place]

## 2021-10-05 NOTE — Patient Instructions (Addendum)
Medication Instructions:  Your physician has recommended you make the following change in your medication:   CHANGE Atenlolol to 50mg  daily *you may use up your 25mg  tablets by taking two together *we have sent the 50mg  tablets to the pharmacy   *If you need a refill on your cardiac medications before your next appointment, please call your pharmacy*  Lab Work: None ordered today.   Testing/Procedures: Your physician has requested that you have an echocardiogram. Echocardiography is a painless test that uses sound waves to create images of your heart. It provides your doctor with information about the size and shape of your heart and how well your heart's chambers and valves are working. This procedure takes approximately one hour. There are no restrictions for this procedure.    Follow-Up: At [facility], you and your health needs are our priority.  As part of our continuing mission to provide you with exceptional heart care, we have created designated Provider Care Teams.  These Care Teams include your primary Cardiologist (physician) and Advanced Practice Providers (APPs -  Physician Assistants and Nurse Practitioners) who all work together to provide you with the care you need, when you need it.  We recommend signing up for the patient portal called "MyChart".  Sign up information is provided on this After Visit Summary.  MyChart is used to connect with patients for Virtual Visits (Telemedicine).  Patients are able to view lab/test results, encounter notes, upcoming appointments, etc.  Non-urgent messages can be sent to your provider as well.   To learn more about what you can do with MyChart, go to [email or web address].    Your next appointment:   2-3 month(s)  The format for your next appointment:   In Person  Provider:   You may see Darren Majestic, MD or one of the following Advanced Practice Providers on your designated Care Team:   Almyra Deforest, PA-C Fabian Sharp, [place]   Loel Dubonnet, NP    Other Instructions  To prevent palpitations: Make sure you are adequately hydrated.  Avoid and/or limit caffeine containing beverages like soda or tea. Exercise regularly.  Manage stress well. Some over the counter medications can cause palpitations such as Benadryl, AdvilPM, TylenolPM. Regular Advil or Tylenol do not cause palpitations.    Heart Healthy Diet Recommendations: A low-salt diet is recommended. Meats should be grilled, baked, or boiled. Avoid fried foods. Focus on lean protein sources like fish or chicken with vegetables and fruits. The American Heart Association is a Microbiologist!    Exercise recommendations: The American Heart Association recommends 150 minutes of moderate intensity exercise weekly. Try 30 minutes of moderate intensity exercise 4-5 times per week. This could include walking, jogging, or swimming.    Tips to Measure your Blood Pressure Correctly  Recommend purchasing an arm blood pressure cuff to monitor at home.   Here's what you can do to ensure a correct reading:  Don't drink a caffeinated beverage or smoke during the 30 minutes before the test.  Sit quietly for five minutes before the test begins.  During the measurement, sit in a chair with your feet on the floor and your arm supported so your elbow is at about heart level.  The inflatable part of the cuff should completely cover at least 80% of your upper arm, and the cuff should be placed on bare skin, not over a shirt.  Don't talk during the measurement.  Have your blood pressure measured twice, with a brief break in between.  If the readings are different by 5 points or more, have it done a third time.  In 2017, new guidelines from the [place], the [employer] of Cardiology, and nine other health organizations lowered the diagnosis of high blood pressure to 130/80 mm Hg or higher for all adults. The guidelines also redefined the various blood  pressure categories to now include normal, elevated, Stage 1 hypertension, Stage 2 hypertension, and hypertensive crisis (see "Blood pressure categories").  Blood pressure categories  Blood pressure category SYSTOLIC (upper number)  DIASTOLIC (lower number)  Normal Less than 120 mm Hg and Less than 80 mm Hg  Elevated 120-129 mm Hg and Less than 80 mm Hg  High blood pressure: Stage 1 hypertension 130-139 mm Hg or 80-89 mm Hg  High blood pressure: Stage 2 hypertension 140 mm Hg or higher or 90 mm Hg or higher  Hypertensive crisis (consult your doctor immediately) Higher than 180 mm Hg and/or Higher than 120 mm Hg  Source: American Heart Association and American Stroke Association. For more on getting your blood pressure under control, buy Controlling Your Blood Pressure, a Special Health Report from [facility].

## 2021-10-06 ENCOUNTER — Encounter (HOSPITAL_BASED_OUTPATIENT_CLINIC_OR_DEPARTMENT_OTHER): Payer: Self-pay | Admitting: Family

## 2021-10-12 ENCOUNTER — Encounter (INDEPENDENT_AMBULATORY_CARE_PROVIDER_SITE_OTHER): Payer: Self-pay

## 2021-10-12 ENCOUNTER — Ambulatory Visit (INDEPENDENT_AMBULATORY_CARE_PROVIDER_SITE_OTHER): Payer: 59

## 2021-10-12 ENCOUNTER — Other Ambulatory Visit: Payer: Self-pay

## 2021-10-12 DIAGNOSIS — I350 Nonrheumatic aortic (valve) stenosis: Secondary | ICD-10-CM

## 2021-10-12 LAB — ECHOCARDIOGRAM COMPLETE
AR max vel: 0.79 cm2
AV Area VTI: 0.82 cm2
AV Area mean vel: 0.86 cm2
AV Mean grad: 38.6 mmHg
AV Peak grad: 65.8 mmHg
Ao pk vel: 4.06 m/s
Area-P 1/2: 3.11 cm2
Calc EF: 70.3 %
S' Lateral: 2.73 cm
Single Plane A2C EF: 57 %
Single Plane A4C EF: 77.7 %

## 2021-10-16 ENCOUNTER — Telehealth: Payer: Self-pay | Admitting: Nurse Practitioner

## 2021-10-16 DIAGNOSIS — I519 Heart disease, unspecified: Secondary | ICD-10-CM

## 2021-10-16 NOTE — Telephone Encounter (Signed)
Left message for patient to call back regarding echo results. Order for PYP nuclear study placed. Will forward to provider for attestation when patient calls back.

## 2021-10-16 NOTE — Telephone Encounter (Signed)
-----   Message from Loel Dubonnet, NP sent at 10/16/2021  1:11 PM EDT ----- Echocardiogram shows normal heart pumping function. Some abnormal strain to heart muscle, recommend PYP nuclear study to rule out amyloid. Aortic stenosis moderate to severe, progressed compared to previous. Continue optimal blood pressure control. Follow up as scheduled in January.

## 2021-10-17 NOTE — Telephone Encounter (Signed)
PYP nuc study has been arranged.  Will forward attestation order to APP for finalizing.

## 2021-10-25 ENCOUNTER — Telehealth (HOSPITAL_COMMUNITY): Payer: Self-pay | Admitting: *Deleted

## 2021-10-25 NOTE — Telephone Encounter (Signed)
Close encounter 

## 2021-10-26 ENCOUNTER — Ambulatory Visit (HOSPITAL_COMMUNITY)
Admission: RE | Admit: 2021-10-26 | Discharge: 2021-10-26 | Disposition: A | Discharge status: Home / Self Care | Payer: 59 | Source: Ambulatory Visit | Attending: Cardiology | Admitting: Cardiology

## 2021-10-26 ENCOUNTER — Other Ambulatory Visit: Payer: Self-pay

## 2021-10-26 DIAGNOSIS — I519 Heart disease, unspecified: Secondary | ICD-10-CM

## 2021-10-26 MED ORDER — TECHNETIUM TC 99M PYROPHOSPHATE
21.0000 | Freq: Once | INTRAVENOUS | Status: AC
  Administered 2021-10-26: 21 via INTRAVENOUS

## 2021-10-31 LAB — HM COLONOSCOPY

## 2021-11-05 ENCOUNTER — Encounter: Payer: Self-pay | Admitting: Physician Assistant

## 2021-12-09 ENCOUNTER — Other Ambulatory Visit: Payer: Self-pay | Admitting: Physician Assistant

## 2021-12-09 DIAGNOSIS — K219 Gastro-esophageal reflux disease without esophagitis: Secondary | ICD-10-CM

## 2021-12-09 DIAGNOSIS — I10 Essential (primary) hypertension: Secondary | ICD-10-CM

## 2021-12-09 DIAGNOSIS — E782 Mixed hyperlipidemia: Secondary | ICD-10-CM

## 2021-12-10 ENCOUNTER — Encounter (HOSPITAL_BASED_OUTPATIENT_CLINIC_OR_DEPARTMENT_OTHER): Payer: Self-pay

## 2021-12-12 ENCOUNTER — Other Ambulatory Visit: Payer: Self-pay | Admitting: Physician Assistant

## 2021-12-12 DIAGNOSIS — F320 Major depressive disorder, single episode, mild: Secondary | ICD-10-CM

## 2022-01-07 ENCOUNTER — Other Ambulatory Visit: Payer: Self-pay

## 2022-01-07 ENCOUNTER — Encounter (HOSPITAL_BASED_OUTPATIENT_CLINIC_OR_DEPARTMENT_OTHER): Payer: Self-pay | Admitting: Family

## 2022-01-07 ENCOUNTER — Ambulatory Visit (HOSPITAL_BASED_OUTPATIENT_CLINIC_OR_DEPARTMENT_OTHER): Payer: 59 | Admitting: Family

## 2022-01-07 VITALS — BP 120/60 | HR 76 | Ht 66.0 in | Wt 181.2 lb

## 2022-01-07 DIAGNOSIS — I25118 Atherosclerotic heart disease of native coronary artery with other forms of angina pectoris: Secondary | ICD-10-CM

## 2022-01-07 DIAGNOSIS — R002 Palpitations: Secondary | ICD-10-CM | POA: Diagnosis not present

## 2022-01-07 DIAGNOSIS — E785 Hyperlipidemia, unspecified: Secondary | ICD-10-CM

## 2022-01-07 DIAGNOSIS — I350 Nonrheumatic aortic (valve) stenosis: Secondary | ICD-10-CM

## 2022-01-07 DIAGNOSIS — Q231 Congenital insufficiency of aortic valve: Secondary | ICD-10-CM

## 2022-01-07 DIAGNOSIS — I10 Essential (primary) hypertension: Secondary | ICD-10-CM

## 2022-01-07 DIAGNOSIS — I77810 Thoracic aortic ectasia: Secondary | ICD-10-CM

## 2022-01-07 MED ORDER — ATENOLOL 50 MG PO TABS: 50.0000 mg | ORAL_TABLET | Freq: Every day | ORAL | 3 refills | Status: DC

## 2022-01-07 NOTE — Progress Notes (Signed)
Office Visit    Patient Name: Darren Lyons Date of Encounter: 01/07/2022  PCP:  Davis, Sara, [place]  Cardiologist:  Shelva Majestic, MD  Advanced Practice Provider:  No care team member to display Electrophysiologist:  None      Chief Complaint    Darren Lyons is a 56 y.o. male with a hx of  HTN, CAD, HLD, aortic stenosis  presents today for hypertension follow up   Past Medical History    Past Medical History:  Diagnosis Date   Essential hypertension 09/19/2020   Hyperlipidemia    Hypertension    Migraine    Mixed hyperlipidemia 09/19/2020   Other chest pain 09/19/2020   Other fatigue 09/19/2020   Skin cancer of arm, right    Past Surgical History:  Procedure Laterality Date   APPENDECTOMY     LEFT HEART CATH AND CORONARY ANGIOGRAPHY N/A 10/02/2020   Procedure: LEFT HEART CATH AND CORONARY ANGIOGRAPHY;  Surgeon: Troy Sine, MD;  Location: [facility];  Service: Cardiovascular;  Laterality: N/A;   RIGHT HEART CATH N/A 10/02/2020   Procedure: RIGHT HEART CATH;  Surgeon: Troy Sine, MD;  Location: [facility];  Service: Cardiovascular;  Laterality: N/A;    Allergies  Allergies  Allergen Reactions   Codeine Rash   Pseudoephedrine Hcl Anxiety   Zyrtec [Cetirizine] Anxiety    History of Present Illness    Darren Lyons is a 56 y.o. male with a hx of HTN, CAD, HLD, aortic stenosis last seen 10/05/21.  Previously evaluated by Dr. Geraldo Pitter for exertional dyspnea. Subsequent testing included LHC 10/02/20 with mild nonobstructive coronary disease with 20% lesions to 2nd marginal, RCA, LAD. Echo 10/02/20 with LVEF 60-65%, LCH, moderate mitral annular calcification, moderate aortic calcification with moderate aortic stenosis, mild dilation of ascending aorta 17mm. Patient has since switched to Dr. Claiborne Billings.   He was seen 10/05/21. He noted palpitations and was encouraged to increase hydration. Atenolol changed to 50mg  QD.  Repeat echo for monitoring of aortic stenosis performed 09/2021 with LVEF 60-65%, mild LVH, gr2DD, bilateral atria mildly dilated, trivial MR, bicuspid aortic valve with moderate to severe stenosis, mild dilation ascending aorta 79mm. Amyloid study strain recommended due to abnormal strain which was equivocal.   He presents today for follow up. We reviewed his echocardiogram and amyloid study. Discussed pathophysiology and typical progression of bicuspid aortic valve. He stays busy working [employer] and playing disc golf. He reports his palpations have improved. When he is laying down his palpitations are less severe. He wishes to continue to defer ZIO monitor as symptoms are well controlled on Atenolol 50mg . Notes his weight has been going up at home which he attributes to eating more while quitting smoking. Last smoked in October. Notes occasional blurry vision which changing from near to far vision. No amaurosis fugax. Eye doctor recommended moisturizing eye drops. Notes no chest pain, pressure, tightness. No shortness of breath at rest nor dyspnea on exertion. No edema, orthopnea, PND. No lightheadedness, dizziness, near syncope, syncope.   EKGs/Labs/Other Studies Reviewed:   The following studies were reviewed today:  Amyloid study 10/26/21  Heart to contralateral lung ratio is between 1-1.5, indeterminate for amyloid. By semi-quantitative assessment scan is consistent with increased heart uptake but less than rib uptake-Grade 1.   Study is equivocal for TTR amyloidosis (visual score of 1/ratio between 1-1.5).   Prior study not available for comparison.   Overall, findings are equivocal  for ATTR amyloidosis.   Consider additional testing if clinically indicated.   Echo 09/2021 1. The left ventricular myocardium is hyperechoic and the strain pattern  shows apical sparing with decreased basal strain ("cherry on top"  pattern). Left ventricular ejection fraction, by estimation, is 60  to 65%.  The left ventricle has normal function.   The left ventricle has no regional wall motion abnormalities. There is  mild concentric left ventricular hypertrophy. Left ventricular diastolic  parameters are consistent with Grade II diastolic dysfunction  (pseudonormalization). Elevated left atrial  pressure. The average left ventricular global longitudinal strain is -14.3  %. The global longitudinal strain is abnormal.   2. Right ventricular systolic function is normal. The right ventricular  size is normal. There is normal pulmonary artery systolic pressure.   3. Left atrial size was mildly dilated.   4. Right atrial size was mildly dilated.   5. The mitral valve is normal in structure. Trivial mitral valve  regurgitation. No evidence of mitral stenosis. Moderate mitral annular  calcification.   6. The aortic valve is bicuspid. There is moderate calcification of the  aortic valve. There is moderate thickening of the aortic valve. Aortic  valve regurgitation is not visualized. Moderate to severe aortic valve  stenosis. Aortic valve mean gradient  measures 38.6 mmHg. Aortic valve Vmax measures 4.06 m/s. Aortic valve  acceleration time measures 119 msec.   7. There is mild dilatation of the ascending aorta, measuring 39 mm.   8. The inferior vena cava is dilated in size with >50% respiratory  variability, suggesting right atrial pressure of 8 mmHg.   Comparison(s): Prior images reviewed side by side. Aortic stenosis has  progressed and is now moderate-to-severe.   Conclusion(s)/Recommendation(s): Consider infiltrative cardiomyopathy  (amyloidosis).   Echo 10/02/20 1. Left ventricular ejection fraction, by estimation, is 60 to 65%. The  left ventricle has normal function. The left ventricle has no regional  wall motion abnormalities. There is mild concentric left ventricular  hypertrophy. Left ventricular diastolic  parameters are indeterminate.   2. Right ventricular systolic  function is normal. The right ventricular  size is normal. Tricuspid regurgitation signal is inadequate for assessing  PA pressure.   3. The mitral valve is normal in structure. Trivial mitral valve  regurgitation. No evidence of mitral stenosis. Moderate mitral annular  calcification.   4. The aortic valve is calcified. There is moderate calcification of the  aortic valve. There is moderate thickening of the aortic valve. Aortic  valve regurgitation is not visualized. Moderate aortic valve stenosis.   5. Aortic dilatation noted. There is mild dilatation of the ascending  aorta, measuring 38 mm.   6. The inferior vena cava is normal in size with greater than 50%  respiratory variability, suggesting right atrial pressure of 3 mmHg.   LHC 10/02/20 2nd Mrg-1 lesion is 20% stenosed. 2nd Mrg-2 lesion is 20% stenosed. Mid RCA lesion is 20% stenosed. Prox LAD to Mid LAD lesion is 20% stenosed. The left ventricular systolic function is normal. LV end diastolic pressure is normal. The left ventricular ejection fraction is greater than 65% by visual estimate.   Mild nonobstructive CAD with common left main ostium with 20% proximal LAD narrowing before the first diagonal vessel; 20% circumflex stenoses; and 20% mid RCA stenosis in a dominant RCA supplying the entire posterior wall to the apex.   Systemic hypertension,   Minimally elevated right heart pressures with mild pulmonary hypertension with a PA pressure mean at 26 mmHg.  Severe aortic valve stenosis with a mean gradient of 42.7 mmHg and a valve area of 0.6 cm.   RECOMMENDATION: I have ordered an echo Doppler study to be done following the patient's cardiac catheterization.  Cardiac auscultation reveals at least a 2/6 mid peaking systolic murmur in the aortic position consistent with his aortic stenosis.  Plan is for discharge later today.  He will follow up with Dr. Geraldo Pitter.  I suspect the patient's recent increasing symptomatology is  secondary to his aortic valve stenosis rather than coronary obstructive disease.   EKG: No EKG is  ordered today.  The ekg independently reviewed from 09/03/21 demonstrated NSR 62 bpm with no acute ST/T wave changes.   Recent Labs: 09/03/2021: ALT 12; BUN 15; Creatinine, Ser 0.83; Hemoglobin 16.7; Platelets 208; Potassium 4.5; Sodium 139; TSH 2.860  Recent Lipid Panel    Component Value Date/Time   CHOL 125 09/03/2021 0853   TRIG 123 09/03/2021 0853   HDL 37 (L) 09/03/2021 0853   CHOLHDL 3.4 09/03/2021 0853   LDLCALC 66 09/03/2021 0853    Home Medications   Current Meds  Medication Sig   aspirin EC 81 MG tablet Take 1 tablet (81 mg total) by mouth daily. Swallow whole.   fluticasone (FLONASE) 50 MCG/ACT nasal spray Place 2 sprays into both nostrils daily as needed for allergies or rhinitis.   ibuprofen (ADVIL) 800 MG tablet TAKE 1 TABLET BY MOUTH EVERY 8 HOURS AS NEEDED FOR PAIN   nitroGLYCERIN (NITROSTAT) 0.4 MG SL tablet Place 1 tablet (0.4 mg total) under the tongue every 5 (five) minutes as needed for chest pain.   omeprazole (PRILOSEC) 40 MG capsule Take 1 capsule by mouth once daily   PARoxetine (PAXIL) 10 MG tablet Take 1 tablet by mouth once daily   rosuvastatin (CRESTOR) 10 MG tablet Take 1 tablet by mouth once daily   [DISCONTINUED] atenolol (TENORMIN) 50 MG tablet Take 1 tablet (50 mg total) by mouth daily.     Review of Systems      All other systems reviewed and are otherwise negative except as noted above.  Physical Exam    VS:  BP 120/60    Pulse 76    Ht 5\' 6"  (1.676 m)    Wt 181 lb 3.2 oz (82.2 kg)    SpO2 98%    BMI 29.25 kg/m  , BMI Body mass index is 29.25 kg/m.  Wt Readings from Last 3 Encounters:  01/07/22 181 lb 3.2 oz (82.2 kg)  10/26/21 160 lb (72.6 kg)  10/05/21 160 lb (72.6 kg)    GEN: Well nourished, well developed, in no acute distress. HEENT: normal. Neck: Supple, no JVD, carotid bruits, or masses. Cardiac: RRR, no rubs, or gallops. Gr 2-6/8  systolic murmur. No clubbing, cyanosis, edema.  Radials/PT 2+ and equal bilaterally.  Respiratory:  Respirations regular and unlabored, clear to auscultation bilaterally. GI: Soft, nontender, nondistended. MS: No deformity or atrophy. Skin: Warm and dry, no rash. Neuro:  Strength and sensation are intact. Psych: Normal affect.  Assessment & Plan    Palpitations - Improved since Atenolol increased to 50mg  QD. As palpitations improved, will defer ZIO monitor. Could be considered in future if needed.  HTN - BP well controlled. Continue current antihypertensive regimen.    Depression - Notes some improvement with Paxil. Continue to follow with PCP. =  HLD, LDL goal <70 - 09/03/21 LDL 66. Continue Rosuvastatin 10mg  QD. Denies myalgias.   Nonobstructive CAD - Stable with no anginal  symptoms. No indication for ischemic evaluation.  EKG 08/2021 with no ST/T wave changes. [place] 09/2020 with scattered 20% stenosis. GDMT includes Atenolol, Rosuvastatin, Aspirin. Heart healthy diet and regular cardiovascular exercise encouraged.    Moderate to severe aortic stenosis - 09/2020 echo with normal LVEF, mild LVH, moderate aortic stenosis. Echo 09/2021 normal LVEF, abnormal strain, moderate to severe aortic stenosis  with mean gradient 38.29mmHg. Amyloid study 10/2021 equivocal. No anasarca, edema concerning for amyloid. No chest pain, syncope, dyspnea. Plan for repeat echo 03/2022 for monitoring. Educated to report chest pain, dyspnea, syncope. Long discussion regarding pathophysiology and typical progression of bicuspid aortic valve.   Tobacco use - Congratulated on 3+ months of cessation. He is very motivated to quit.  Recommend utilization of [id number].   Dilation ascending aorta - 09/2020 measured 55mm by echo. 09/2021 Mild dilation 92m. Continue optimal BP control.   Disposition: Follow up in April or May 2023 (after echocardiogram) with Dr. Claiborne Billings or APP.   Signed, Loel Dubonnet, NP 01/07/2022, 4:31  PM [place]

## 2022-01-07 NOTE — Patient Instructions (Addendum)
Medication Instructions:  Continue your current medications.   Can use Corcidin as needed for cold like symptoms or sinuses.  Recommend trying saline eye drops for blurry vision.   *If you need a refill on your cardiac medications before your next appointment, please call your pharmacy*   Lab Work: None ordered today.   Testing/Procedures: Your echocardiogram in October showed moderate to severe aortic stenosis. This was progressed compared to previous. This will be rechecked in 6 months.   Your physician has requested that you have an echocardiogram in April 2023. Echocardiography is a painless test that uses sound waves to create images of your heart. It provides your doctor with information about the size and shape of your heart and how well your hearts chambers and valves are working. This procedure takes approximately one hour. There are no restrictions for this procedure.   Follow-Up: At [facility], you and your health needs are our priority.  As part of our continuing mission to provide you with exceptional heart care, we have created designated Provider Care Teams.  These Care Teams include your primary Cardiologist (physician) and Advanced Practice Providers (APPs -  Physician Assistants and Nurse Practitioners) who all work together to provide you with the care you need, when you need it.  We recommend signing up for the patient portal called "MyChart".  Sign up information is provided on this After Visit Summary.  MyChart is used to connect with patients for Virtual Visits (Telemedicine).  Patients are able to view lab/test results, encounter notes, upcoming appointments, etc.  Non-urgent messages can be sent to your provider as well.   To learn more about what you can do with MyChart, go to [email or web address].    Your next appointment:   In April or May (after echocardiogram) with Dr. Claiborne Billings or APP  Other Instructions Aortic Valve Stenosis Aortic valve stenosis is a  narrowing of the aortic valve in the heart. The aortic valve opens and closes to regulate blood flow between the left side of the heart (left ventricle) and the artery that leads away from the heart (aorta). When the aortic valve becomes narrow, it is difficult for the heart to pump blood out to the body, which causes the heart to work harder. The extra work can weaken the heart muscle over time. Aortic valve stenosis can range from mild to severe. If it is not treated, it can become more severe over time and lead to heart failure. What are the causes? This condition may be caused by: Buildup of calcium around and on the aortic valve. This can occur with aging. This is the most common cause of aortic valve stenosis. A heart problem that developed in the womb (birth defect). Rheumatic fever. Radiation to the chest. What increases the risk? You may be more likely to develop this condition if: You are older than age 34. You were born with an abnormal bicuspid valve. What are the signs or symptoms? You may not have any symptoms until your condition becomes severe. It may take 10-20 years for mild or moderate aortic valve stenosis to become severe. Symptoms may include: Shortness of breath. This may get worse during physical activity. Feeling unusually weak and tired (fatigue). Extreme discomfort in the chest, neck, or arm during physical activity (angina). A heartbeat that is irregular or faster than normal (palpitations). Dizziness or fainting. This may happen when you get physically tired or after you take certain heart medicines, such as nitroglycerin. How is this diagnosed? This condition  may be diagnosed with: A physical exam. Echocardiogram. This is a type of imaging test that uses sound waves (ultrasound) to make images of your heart. There are two kinds of this test that may be used. Transthoracic echocardiogram (TTE). For this type, a wand-like tool (transducer) is moved over your chest  to create ultrasound images that are recorded by a computer. Transesophageal echocardiogram (TEE). For this type, a flexible tube (probe) is inserted down the part of the body that moves food from your mouth to your stomach (esophagus). The heart and the esophagus are close to each other. Your health care provider will use the probe to take clear, detailed pictures of the heart. Cardiac catheterization. For this procedure, a small, thin tube (catheter) is passed through a large vein in your neck, groin, or arm. The catheter is used to get information about arteries, structures, blood pressure, and oxygen levels in your heart. Stress tests. These are tests that evaluate the blood supply to your heart and your heart's response to exercise. You may work with a health care provider who specializes in the heart (cardiologist) for diagnosis and treatment. How is this treated? Treatment depends on how severe your condition is and what your symptoms are. You will need to have your heart checked regularly to make sure that your condition is not getting worse or causing serious problems. Treatment may also include: Surgery to replace your aortic valve. This is the most common treatment for aortic valve stenosis, and it is the only treatment to cure the condition. Several types of surgeries are available. The surgery may be done: Through a large incision over your heart (open-heart surgery). Through small incisions, using a flexible tube called a catheter (transcatheter aortic valve replacement, TAVR). Medicines that help to keep your heart rate regular. Medicines that thin your blood (anticoagulants) to prevent blood clots. Antibiotic medicines to help prevent infection. If your condition is mild, you may only need regular follow-up visits for monitoring. Follow these instructions at home: Lifestyle Limit alcohol intake to no more than 1 drink a day for nonpregnant women and 2 drinks a day for men. One drink  equals 12 oz of beer, 5 oz of wine, or 1 oz of hard liquor. Do not use any products that contain nicotine or tobacco, such as cigarettes and e-cigarettes. If you need help quitting, ask your health care provider. Work with your health care provider to manage your blood pressure and cholesterol. Maintain a healthy weight. Eating and drinking  Eat a heart-healthy diet that includes plenty of fresh fruits and vegetables, whole grains, lean protein, and low-fat or nonfat dairy. Limit how much caffeine you drink. Caffeine can affect your heart's rate and rhythm. Avoid foods that are: High in salt (sodium), saturated fat, or sugar. Canned or highly processed. [place] Pro. Follow instructions from your health care provider about any other eating or drinking restrictions. Activity Exercise regularly and return to your normal activities as told by your health care provider. Ask your health care provider what amount and type of physical activity is safe for you. If your aortic valve stenosis is mild, you may only need to avoid very intense physical activity, such as heavy weight lifting. The more severe your aortic valve stenosis is, the more activities you may need to avoid. If you are taking blood thinners: Before you take any medicines that contain aspirin or NSAIDs, talk with your health care provider. These medicines increase your risk for dangerous bleeding. Take your medicine exactly as  told, at the same time every day. Avoid activities that could cause injury or bruising, and follow instructions about how to prevent falls. Wear a medical alert bracelet or carry a card that lists what medicines you take. General instructions Take over-the-counter and prescription medicines only as told by your health care provider. If you were prescribed an antibiotic, take it as told by your health care provider. Do not stop taking the antibiotic even if you start to feel better. If you are a woman and you plan to  become pregnant, talk with your health care provider before you become pregnant. Before you have any type of medical or dental procedure or surgery, tell all health care providers that you have aortic valve stenosis. This may affect the treatment that you receive. Keep all follow-up visits as told by your health care provider. This is important. Contact a health care provider if: You have a fever. Get help right away if: You develop any of the following symptoms: Chest pain. Chest tightness. Shortness of breath. Trouble breathing. You feel light-headed. You feel like you might faint. Your heartbeat is irregular or faster than normal. These symptoms may represent a serious problem that is an emergency. Do not wait to see if the symptoms will go away. Get medical help right away. Call your local emergency services (911 in the [place] Do not drive yourself to the hospital. Summary Aortic valve stenosis is a narrowing of the aortic valve in the heart. The aortic valve opens and closes to regulate blood flow between the left side of the heart (left ventricle) and the artery that leads away from the heart (aorta). Aortic valve stenosis can range from mild to severe. If it is not treated, it can become more severe over time and lead to heart failure. Treatment depends on how severe your condition is and what your symptoms are. You will need to have your heart checked regularly to make sure that your condition is not getting worse or causing serious problems. Exercise regularly and return to your normal activities as told by your health care provider. Ask your health care provider what amount and type of physical activity is safe for you. This information is not intended to replace advice given to you by your health care provider. Make sure you discuss any questions you have with your health care provider. Document Revised: 05/10/2021 Document Reviewed: 05/24/2020 Elsevier Patient Education  2022  [place] of Quitting Smoking Quitting smoking is a physical and mental challenge. You will face cravings, withdrawal symptoms, and temptation. Before quitting, work with your health care provider to make a plan that can help you manage quitting. Preparation can help you quit and keep you from giving in. How to manage lifestyle changes Managing stress Stress can make you want to smoke, and wanting to smoke may cause stress. It is important to find ways to manage your stress. You might try some of the following: Practice relaxation techniques. Breathe slowly and deeply, in through your nose and out through your mouth. Listen to music. Soak in a bath or take a shower. Imagine a peaceful place or vacation. Get some support. Talk with family or friends about your stress. Join a support group. Talk with a counselor or therapist. Get some physical activity. Go for a walk, run, or bike ride. Play a favorite sport. Practice yoga.  Medicines Talk with your health care provider about medicines that might help you deal with cravings and make quitting  easier for you. Relationships Social situations can be difficult when you are quitting smoking. To manage this, you can: Avoid parties and other social situations where people might be smoking. Avoid alcohol. Leave right away if you have the urge to smoke. Explain to your family and friends that you are quitting smoking. Ask for support and let them know you might be a bit grumpy. Plan activities where smoking is not an option. General instructions Be aware that many people gain weight after they quit smoking. However, not everyone does. To keep from gaining weight, have a plan in place before you quit and stick to the plan after you quit. Your plan should include: Having healthy snacks. When you have a craving, it may help to: Eat popcorn, carrots, celery, or other cut vegetables. Chew sugar-free gum. Changing how you  eat. Eat small portion sizes at meals. Eat 4-6 small meals throughout the day instead of 1-2 large meals a day. Be mindful when you eat. Do not watch television or do other things that might distract you as you eat. Exercising regularly. Make time to exercise each day. If you do not have time for a long workout, do short bouts of exercise for 5-10 minutes several times a day. Do some form of strengthening exercise, such as weight lifting. Do some exercise that gets your heart beating and causes you to breathe deeply, such as walking fast, running, swimming, or biking. This is very important. Drinking plenty of water or other low-calorie or no-calorie drinks. Drink 6-8 glasses of water daily.  How to recognize withdrawal symptoms Your body and mind may experience discomfort as you try to get used to not having nicotine in your system. These effects are called withdrawal symptoms. They may include: Feeling hungrier than normal. Having trouble concentrating. Feeling irritable or restless. Having trouble sleeping. Feeling depressed. Craving a cigarette. To manage withdrawal symptoms: Avoid places, people, and activities that trigger your cravings. Remember why you want to quit. Get plenty of sleep. Avoid coffee and other caffeinated drinks. These may worsen some of your symptoms. These symptoms may surprise you. But be assured that they are normal to have when quitting smoking. How to manage cravings Come up with a plan for how to deal with your cravings. The plan should include the following: A definition of the specific situation you want to deal with. An alternative action you will take. A clear idea for how this action will help. The name of someone who might help you with this. Cravings usually last for 5-10 minutes. Consider taking the following actions to help you with your plan to deal with cravings: Keep your mouth busy. Chew sugar-free gum. Suck on hard candies or a  straw. Brush your teeth. Keep your hands and body busy. Change to a different activity right away. Squeeze or play with a ball. Do an activity or a hobby, such as making bead jewelry, practicing needlepoint, or working with wood. Mix up your normal routine. Take a short exercise break. Go for a quick walk or run up and down stairs. Focus on doing something kind or helpful for someone else. Call a friend or family member to talk during a craving. Join a support group. Contact a quitline. Where to find support To get help or find a support group: Call the [facility]'s Smoking Quitline: 1-800-QUIT NOW 571-138-4630) Visit the website of the Substance Abuse and Mental Health Services Administration: ktimeonline.com Text QUIT to SmokefreeTXT: 517616 Where to find more information  Visit these websites to find more information on quitting smoking: [place]: www.smokefree.gov American Lung Association: www.lung.org American Cancer Society: www.cancer.org Centers for Disease Control and Prevention: http://www.wolf.info/ American Heart Association: www.heart.org Contact a health care provider if: You want to change your plan for quitting. The medicines you are taking are not helping. Your eating feels out of control or you cannot sleep. Get help right away if: You feel depressed or become very anxious. Summary Quitting smoking is a physical and mental challenge. You will face cravings, withdrawal symptoms, and temptation to smoke again. Preparation can help you as you go through these challenges. Try different techniques to manage stress, handle social situations, and prevent weight gain. You can deal with cravings by keeping your mouth busy (such as by chewing gum), keeping your hands and body busy, calling family or friends, or contacting a quitline for people who want to quit smoking. You can deal with withdrawal symptoms by avoiding places where people smoke, getting  plenty of rest, and avoiding drinks with caffeine. This information is not intended to replace advice given to you by your health care provider. Make sure you discuss any questions you have with your health care provider. Document Revised: 08/17/2021 Document Reviewed: 09/28/2019 Elsevier Patient Education  [place].

## 2022-03-09 ENCOUNTER — Other Ambulatory Visit: Payer: Self-pay | Admitting: Physician Assistant

## 2022-03-09 DIAGNOSIS — K219 Gastro-esophageal reflux disease without esophagitis: Secondary | ICD-10-CM

## 2022-03-09 DIAGNOSIS — E782 Mixed hyperlipidemia: Secondary | ICD-10-CM

## 2022-03-09 DIAGNOSIS — I10 Essential (primary) hypertension: Secondary | ICD-10-CM

## 2022-03-10 ENCOUNTER — Other Ambulatory Visit: Payer: Self-pay | Admitting: Physician Assistant

## 2022-03-10 DIAGNOSIS — R002 Palpitations: Secondary | ICD-10-CM

## 2022-03-10 DIAGNOSIS — I350 Nonrheumatic aortic (valve) stenosis: Secondary | ICD-10-CM

## 2022-03-10 MED ORDER — ATENOLOL 50 MG PO TABS: 50.0000 mg | ORAL_TABLET | Freq: Every day | ORAL | 0 refills | Status: DC

## 2022-03-15 ENCOUNTER — Other Ambulatory Visit: Payer: Self-pay

## 2022-03-15 ENCOUNTER — Ambulatory Visit: Payer: 59 | Admitting: Physician Assistant

## 2022-03-15 ENCOUNTER — Encounter: Payer: Self-pay | Admitting: Physician Assistant

## 2022-03-15 VITALS — BP 142/88 | HR 72 | Temp 97.1°F | Resp 14 | Ht 67.5 in | Wt 176.0 lb

## 2022-03-15 DIAGNOSIS — F320 Major depressive disorder, single episode, mild: Secondary | ICD-10-CM | POA: Diagnosis not present

## 2022-03-15 DIAGNOSIS — K219 Gastro-esophageal reflux disease without esophagitis: Secondary | ICD-10-CM

## 2022-03-15 DIAGNOSIS — E782 Mixed hyperlipidemia: Secondary | ICD-10-CM

## 2022-03-15 DIAGNOSIS — I350 Nonrheumatic aortic (valve) stenosis: Secondary | ICD-10-CM | POA: Diagnosis not present

## 2022-03-15 DIAGNOSIS — I10 Essential (primary) hypertension: Secondary | ICD-10-CM | POA: Diagnosis not present

## 2022-03-15 MED ORDER — PAROXETINE HCL 10 MG PO TABS: 10.0000 mg | ORAL_TABLET | Freq: Every day | ORAL | 1 refills | Status: DC

## 2022-03-15 MED ORDER — FLUTICASONE PROPIONATE 50 MCG/ACT NA SUSP: 2.0000 | Freq: Every day | NASAL | 3 refills | Status: DC | PRN

## 2022-03-15 NOTE — Progress Notes (Signed)
? ?Subjective:  ?Patient ID: Darren Lyons, male    DOB: [birth year]  Age: 56 y.o. MRN: [id number] ? ?Chief Complaint  ?Patient presents with  ? Hypertension  ? Hyperlipidemia  ? ? ?HPI ? Pt presents for follow up of hypertension. The patient is tolerating the medication well without side effects. Compliance with treatment has been good; including taking medication as directed , maintains a healthy diet and regular exercise regimen , and following up as directed. He has not been checking bp regularly at home Currently he is on tenormin '100mg'$  daily ? ?Pt with history of aortic valve stenosis - he is following with cardiology for this issue and will follow up with them twice yearly ? ?Mixed hyperlipidemia  ?Pt presents with hyperlipidemia.  Compliance with treatment has been good The patient is compliant with medications, maintains a low cholesterol diet , follows up as directed , and maintains an exercise regimen . The patient denies experiencing any hypercholesterolemia related symptoms. Pt currently on crestor '10mg'$  qd ? ?Pt with history of anxiety - stable on paxil '10mg'$  qd - working well for him ? ?Pt with history of GERD - stable on prilosec '40mg'$  qd ? ? ?Current Outpatient Medications on File Prior to Visit  ?Medication Sig Dispense Refill  ? aspirin EC 81 MG tablet Take 1 tablet (81 mg total) by mouth daily. Swallow whole. 90 tablet 3  ? atenolol (TENORMIN) 100 MG tablet Take 100 mg by mouth daily.    ? ibuprofen (ADVIL) 800 MG tablet TAKE 1 TABLET BY MOUTH EVERY 8 HOURS AS NEEDED FOR PAIN 270 tablet 0  ? nitroGLYCERIN (NITROSTAT) 0.4 MG SL tablet Place 1 tablet (0.4 mg total) under the tongue every 5 (five) minutes as needed for chest pain. 90 tablet 3  ? omeprazole (PRILOSEC) 40 MG capsule Take 1 capsule by mouth once daily 90 capsule 0  ? rosuvastatin (CRESTOR) 10 MG tablet Take 1 tablet by mouth once daily 90 tablet 0  ? ?No current facility-administered medications on file prior to visit.  ? ?Past Medical  History:  ?Diagnosis Date  ? Essential hypertension 09/19/2020  ? Hyperlipidemia   ? Hypertension   ? Migraine   ? Mixed hyperlipidemia 09/19/2020  ? Other chest pain 09/19/2020  ? Other fatigue 09/19/2020  ? Skin cancer of arm, right   ? ?Past Surgical History:  ?Procedure Laterality Date  ? APPENDECTOMY    ? LEFT HEART CATH AND CORONARY ANGIOGRAPHY N/A 10/02/2020  ? Procedure: LEFT HEART CATH AND CORONARY ANGIOGRAPHY;  Surgeon: Troy Sine, MD;  Location: [facility];  Service: Cardiovascular;  Laterality: N/A;  ? RIGHT HEART CATH N/A 10/02/2020  ? Procedure: RIGHT HEART CATH;  Surgeon: Troy Sine, MD;  Location: [facility];  Service: Cardiovascular;  Laterality: N/A;  ?  ?Family History  ?Problem Relation Age of Onset  ? Diabetes Mother   ? Hypertension Mother   ? Cancer Father   ? ?Social History  ? ?Socioeconomic History  ? Marital status: Unknown  ?  Spouse name: Not on file  ? Number of children: Not on file  ? Years of education: Not on file  ? Highest education level: Not on file  ?Occupational History  ? Not on file  ?Tobacco Use  ? Smoking status: Every Day  ?  Packs/day: 1.00  ?  Types: Cigarettes  ? Smokeless tobacco: Never  ?Vaping Use  ? Vaping Use: Never used  ?Substance and Sexual Activity  ?  Alcohol use: Never  ? Drug use: Never  ? Sexual activity: Not on file  ?Other Topics Concern  ? Not on file  ?Social History Narrative  ? Not on file  ? ?Social Determinants of Health  ? ?Financial Resource Strain: Not on file  ?Food Insecurity: Not on file  ?Transportation Needs: Not on file  ?Physical Activity: Not on file  ?Stress: Not on file  ?Social Connections: Not on file  ? ? ?Review of Systems ?CONSTITUTIONAL: Negative for chills, fatigue, fever, unintentional weight gain and unintentional weight loss.  ? ?CARDIOVASCULAR: Negative for chest pain, dizziness, palpitations and pedal edema.  ?RESPIRATORY: Negative for recent cough and dyspnea.  ?GASTROINTESTINAL: Negative for abdominal  pain, acid reflux symptoms, constipation, diarrhea, nausea and vomiting.  ?MSK: Negative for arthralgias and myalgias.  ?INTEGUMENTARY: Negative for rash.  ? ?PSYCHIATRIC: Negative for sleep disturbance and to question depression screen.  Negative for depression, negative for anhedonia.  ?   ? ? ?Objective:  ?BP (!) 142/88   Pulse 72   Temp (!) 97.1 ?F (36.2 ?C)   Resp 14   Ht 5' 7.5" (1.715 m)   Wt 176 lb (79.8 kg)   BMI 27.16 kg/m?  ? ? ?  03/15/2022  ?  8:42 AM 01/07/2022  ?  3:30 PM 10/26/2021  ? 12:42 PM  ?BP/Weight  ?Systolic BP 546 503   ?Diastolic BP 88 60   ?Wt. (Lbs) 176 181.2 160  ?BMI 27.16 kg/m2 29.25 kg/m2 25.82 kg/m2  ? ? ?Physical Exam ?PHYSICAL EXAM:  ? ?VS: BP (!) 142/88   Pulse 72   Temp (!) 97.1 ?F (36.2 ?C)   Resp 14   Ht 5' 7.5" (1.715 m)   Wt 176 lb (79.8 kg)   BMI 27.16 kg/m?  ? ?GEN: Well nourished, well developed, in no acute distress  ? ?Cardiac: RRR; no murmurs, rubs, or gallops,no edema -  ?Respiratory:  normal respiratory rate and pattern with no distress - normal breath sounds with no rales, rhonchi, wheezes or rubs ?GI: normal bowel sounds, no masses or tenderness ? ?Skin: warm and dry, no rash  ? ?Psych: euthymic mood, appropriate affect and demeanor ? ?Diabetic Foot Exam - Simple   ?No data filed ?  ?  ? ?Lab Results  ?Component Value Date  ? WBC 10.3 09/03/2021  ? HGB 16.7 09/03/2021  ? HCT 48.4 09/03/2021  ? PLT 208 09/03/2021  ? GLUCOSE 98 09/03/2021  ? CHOL 125 09/03/2021  ? TRIG 123 09/03/2021  ? HDL 37 (L) 09/03/2021  ? [place] 66 09/03/2021  ? ALT 12 09/03/2021  ? AST 15 09/03/2021  ? NA 139 09/03/2021  ? K 4.5 09/03/2021  ? CL 100 09/03/2021  ? CREATININE 0.83 09/03/2021  ? BUN 15 09/03/2021  ? CO2 23 09/03/2021  ? TSH 2.860 09/03/2021  ? ? ? ? ?Assessment & Plan:  ? ?Problem List Items Addressed This Visit   ? ?  ? Cardiovascular and Mediastinum  ? Essential hypertension - Primary  ? Relevant Medications  ? atenolol (TENORMIN) 100 MG tablet  ? Other Relevant Orders   ? CBC with Differential/Platelet  ? Comprehensive metabolic panel  ? Lipid panel  ? Aortic valve stenosis  ? Relevant Medications  ? atenolol (TENORMIN) 100 MG tablet  ?  ? Other  ? Mixed hyperlipidemia  ? Relevant Medications  ? atenolol (TENORMIN) 100 MG tablet  ? Other Relevant Orders  ? Lipid panel  ? ?Other Visit Diagnoses   ? ?  Depression, major, single episode, mild ([place])      ? Relevant Medications  ? PARoxetine (PAXIL) 10 MG tablet  ? Gastroesophageal reflux disease without esophagitis      ? ?  ?. ? ?Meds ordered this encounter  ?Medications  ? PARoxetine (PAXIL) 10 MG tablet  ?  Sig: Take 1 tablet (10 mg total) by mouth daily.  ?  Dispense:  90 tablet  ?  Refill:  1  ?  Order Specific Question:   Supervising Provider  ?  AnswerRochel Brome [[id number]]  ? fluticasone (FLONASE) 50 MCG/ACT nasal spray  ?  Sig: Place 2 sprays into both nostrils daily as needed for allergies or rhinitis.  ?  Dispense:  27 mL  ?  Refill:  3  ?  Order Specific Question:   Supervising Provider  ?  AnswerRochel Brome [[id number]]  ? ? ?Orders Placed This Encounter  ?Procedures  ? CBC with Differential/Platelet  ? Comprehensive metabolic panel  ? Lipid panel  ?  ? ?Follow-up: Return in about 6 months (around 09/15/2022) for chronic fasting follow up - 4 weeks nurse visit bp check. ? ?An After Visit Summary was printed and given to the patient. ? ?SARA R Rene Gonsoulin, PA-C ?[place] ?[phone] ?

## 2022-03-16 LAB — CBC WITH DIFFERENTIAL/PLATELET
Basophils Absolute: 0.1 10*3/uL (ref 0.0–0.2)
Basos: 1 %
EOS (ABSOLUTE): 0.1 10*3/uL (ref 0.0–0.4)
Eos: 2 %
Hematocrit: 47.1 % (ref 37.5–51.0)
Hemoglobin: 16.3 g/dL (ref 13.0–17.7)
Immature Grans (Abs): 0 10*3/uL (ref 0.0–0.1)
Immature Granulocytes: 0 %
Lymphocytes Absolute: 2.5 10*3/uL (ref 0.7–3.1)
Lymphs: 39 %
MCH: 29.7 pg (ref 26.6–33.0)
MCHC: 34.6 g/dL (ref 31.5–35.7)
MCV: 86 fL (ref 79–97)
Monocytes Absolute: 0.4 10*3/uL (ref 0.1–0.9)
Monocytes: 6 %
Neutrophils Absolute: 3.3 10*3/uL (ref 1.4–7.0)
Neutrophils: 52 %
Platelets: 174 10*3/uL (ref 150–450)
RBC: 5.48 x10E6/uL (ref 4.14–5.80)
RDW: 12.8 % (ref 11.6–15.4)
WBC: 6.3 10*3/uL (ref 3.4–10.8)

## 2022-03-16 LAB — COMPREHENSIVE METABOLIC PANEL
ALT: 18 IU/L (ref 0–44)
AST: 20 IU/L (ref 0–40)
Albumin/Globulin Ratio: 2.9 — ABNORMAL HIGH (ref 1.2–2.2)
Albumin: 5 g/dL — ABNORMAL HIGH (ref 3.8–4.9)
Alkaline Phosphatase: 76 IU/L (ref 44–121)
BUN/Creatinine Ratio: 13 (ref 9–20)
BUN: 14 mg/dL (ref 6–24)
Bilirubin Total: 0.8 mg/dL (ref 0.0–1.2)
CO2: 27 mmol/L (ref 20–29)
Calcium: 9.6 mg/dL (ref 8.7–10.2)
Chloride: 102 mmol/L (ref 96–106)
Creatinine, Ser: 1.11 mg/dL (ref 0.76–1.27)
Globulin, Total: 1.7 g/dL (ref 1.5–4.5)
Glucose: 96 mg/dL (ref 70–99)
Potassium: 5 mmol/L (ref 3.5–5.2)
Sodium: 141 mmol/L (ref 134–144)
Total Protein: 6.7 g/dL (ref 6.0–8.5)
eGFR: 77 mL/min/{1.73_m2} (ref >59)

## 2022-03-16 LAB — LIPID PANEL
Chol/HDL Ratio: 3.5 ratio (ref 0.0–5.0)
Cholesterol, Total: 145 mg/dL (ref 100–199)
HDL: 42 mg/dL (ref >39)
LDL Chol Calc (NIH): 88 mg/dL (ref 0–99)
Triglycerides: 76 mg/dL (ref 0–149)
VLDL Cholesterol Cal: 15 mg/dL (ref 5–40)

## 2022-03-16 LAB — CARDIOVASCULAR RISK ASSESSMENT

## 2022-04-09 ENCOUNTER — Encounter (HOSPITAL_BASED_OUTPATIENT_CLINIC_OR_DEPARTMENT_OTHER): Payer: Self-pay

## 2022-04-09 ENCOUNTER — Ambulatory Visit (INDEPENDENT_AMBULATORY_CARE_PROVIDER_SITE_OTHER): Payer: 59

## 2022-04-09 DIAGNOSIS — Q231 Congenital insufficiency of aortic valve: Secondary | ICD-10-CM | POA: Diagnosis not present

## 2022-04-09 DIAGNOSIS — I350 Nonrheumatic aortic (valve) stenosis: Secondary | ICD-10-CM

## 2022-04-09 LAB — ECHOCARDIOGRAM COMPLETE
AR max vel: 0.82 cm2
AV Area VTI: 0.8 cm2
AV Area mean vel: 0.88 cm2
AV Mean grad: 42 mmHg
AV Peak grad: 75.7 mmHg
Ao pk vel: 4.35 m/s
Area-P 1/2: 3.43 cm2
Calc EF: 65.7 %
S' Lateral: 2.08 cm
Single Plane A2C EF: 60.2 %
Single Plane A4C EF: 72.1 %

## 2022-05-01 ENCOUNTER — Encounter: Payer: Self-pay | Admitting: Cardiovascular Disease

## 2022-05-01 ENCOUNTER — Ambulatory Visit: Payer: 59 | Admitting: Cardiovascular Disease

## 2022-05-01 DIAGNOSIS — I350 Nonrheumatic aortic (valve) stenosis: Secondary | ICD-10-CM | POA: Diagnosis not present

## 2022-05-01 DIAGNOSIS — I77810 Thoracic aortic ectasia: Secondary | ICD-10-CM | POA: Diagnosis not present

## 2022-05-01 DIAGNOSIS — Q231 Congenital insufficiency of aortic valve: Secondary | ICD-10-CM

## 2022-05-01 DIAGNOSIS — Z01812 Encounter for preprocedural laboratory examination: Secondary | ICD-10-CM

## 2022-05-01 DIAGNOSIS — I10 Essential (primary) hypertension: Secondary | ICD-10-CM | POA: Diagnosis not present

## 2022-05-01 DIAGNOSIS — E785 Hyperlipidemia, unspecified: Secondary | ICD-10-CM

## 2022-05-01 DIAGNOSIS — G4719 Other hypersomnia: Secondary | ICD-10-CM

## 2022-05-01 DIAGNOSIS — K219 Gastro-esophageal reflux disease without esophagitis: Secondary | ICD-10-CM

## 2022-05-01 DIAGNOSIS — Q2381 Bicuspid aortic valve: Secondary | ICD-10-CM

## 2022-05-01 NOTE — Patient Instructions (Addendum)
Medication Instructions:  ?Continue same medications ?*If you need a refill on your cardiac medications before your next appointment, please call your pharmacy* ? ? ?Lab Work: ?Bmet,cbc,pt  Thursday 05/03/11  Lab orders enclosed ? ? ?Testing/Procedures: ?Cardiac Cath     Follow instructions below ? ? ?Follow-Up: ?At [facility], you and your health needs are our priority.  As part of our continuing mission to provide you with exceptional heart care, we have created designated Provider Care Teams.  These Care Teams include your primary Cardiologist (physician) and Advanced Practice Providers (APPs -  Physician Assistants and Nurse Practitioners) who all work together to provide you with the care you need, when you need it. ? ?We recommend signing up for the patient portal called "MyChart".  Sign up information is provided on this After Visit Summary.  MyChart is used to connect with patients for Virtual Visits (Telemedicine).  Patients are able to view lab/test results, encounter notes, upcoming appointments, etc.  Non-urgent messages can be sent to your provider as well.   ?To learn more about what you can do with MyChart, go to [email or web address].   ? ?Your next appointment:  To Be Determined  ?  ? ?The format for your next appointment: Office ? ? ?Provider:  Dr.Kelly ? ? ? ? ?[facility] [address] ?[address] ?[place] [address] ?Dept: [phone] ?Loc: [phone] ? ?Yaniel Limbaugh  05/01/2022 ? ?You are scheduled for a Cardiac Cath on Tuesday 05/07/22  with Dr. Shelva Majestic. ? ?1. Please arrive at 5:30 am the Main Entrance A at [facility]: [place], [place] [address] at  (This time is two hours before your procedure to ensure your preparation). Free valet parking service is available.  ? ?Special note: Every effort is made to have your procedure done on time. Please understand that emergencies sometimes  delay scheduled procedures. ? ?2. Diet: Do not eat solid foods after midnight.  You may have clear liquids until 5 AM upon the day of the procedure. ? ?3. Labs: You will need to have blood drawn on Thursday 05/02/22 at Dr.Kelly's office. You do not need to be fasting. ? ?4. Medication instructions in preparation for your procedure: ? ? ? ? ?On the morning of your procedure, take Aspirin and any morning medicines NOT listed above.  You may use sips of water. ? ?5. Plan to go home the same day, you will only stay overnight if medically necessary. ?6. You MUST have a responsible adult to drive you home. ?7. An adult MUST be with you the first 24 hours after you arrive home. ?8. Bring a current list of your medications, and the last time and date medication taken. ?9. Bring ID and current insurance cards. ?10.Please wear clothes that are easy to get on and off and wear slip-on shoes. ? ?Thank you for allowing [place] to care for you! ?  -- [facility] ? ? ? ?Important Information About Sugar ? ? ? ? ? ? ?

## 2022-05-01 NOTE — Progress Notes (Addendum)
? ?Cardiology Office Note   ? ?Date:  05/01/2022  ? ?ID:  Darren Lyons, DOB 23-Apr-1966, MRN [id number] ? ?PCP:  Marge Duncans, PA-C  ?Cardiologist:  Shelva Majestic, MD  ? ?No chief complaint on file. ? ? ?History of Present Illness:  ?Darren Lyons is a 56 y.o. male who has a history of hypertension, CAD, hyperlipidemia, aortic valve stenosis who has previously been evaluated by Dr. Geraldo Pitter and presents to the office today in follow-up of his most recent echo Doppler study demonstrating progressive aortic stenosis. ? ?Darren Lyons was evaluated by Dr. Geraldo Pitter and in October 2021 was referred to me for diagnostic catheterization with complaints episodic chest pain.  During catheterization, there was a suggestion of aortic valve stenosis and as result right heart catheterization was performed.  Right heart pressures were upper normal and his aortic valve peak to peak gradient was 43 with a mean gradient of 42.  He was found to have mild nonobstructive CAD with 20% stenoses in the LAD, circumflex marginal, and RCA.  He underwent echo Doppler study in October 2022 which showed EF 60 to 65%, mild LVH, grade 2 diastolic dysfunction, mild biatrial enlargement, with suggestion of a bicuspid aortic valve with moderate to severe stenosis with a mean gradient of 38.6 and peak gradient of 65.8.  mild dilation of ascending aorta 39 mm.  He also underwent an amyloid study which was equivocal.  Recently, he was evaluated by Laurann Montana, NP.  He denies any recent chest pain.  He plays disc golf and walking fast uphill he has noticed exertional dyspnea.  He denies any recent chest pain.  He denies any presyncope or syncope.  He was referred for a follow-up echo Doppler study on April 09, 2022 continues to show normal LV function with EF 65 to 70% with moderate left ventricular hypertrophy.  Significant calcification of the aortic valve with mean gradient of 42 mm and aortic valve peak gradient at 75.7 mm.  Ascending aorta was 40  mm. ? ?With his progressive aortic valve stenosis, he presents to the office today for evaluation.  Presently, he denies any chest pain.  He admits to some shortness of breath with increased activity.  His major complaint is that of fatigability and no energy and feeling tired.  He is unaware of any arrhythmias.  He denies presyncope or syncope.  He typically goes to bed at 10 PM and wakes up at 4.  An Epworth Sleepiness Scale score was calculated in the office today and this endorsed at 17 consistent with excessive daytime sleepiness.  He had an episode of COVID in February 2022 and still has not recovered with reference to his sense of taste and smell. He presents for evaluation. ? ? ?Past Medical History:  ?Diagnosis Date  ? Essential hypertension 09/19/2020  ? Hyperlipidemia   ? Hypertension   ? Migraine   ? Mixed hyperlipidemia 09/19/2020  ? Other chest pain 09/19/2020  ? Other fatigue 09/19/2020  ? Skin cancer of arm, right   ? ? ?Past Surgical History:  ?Procedure Laterality Date  ? APPENDECTOMY    ? LEFT HEART CATH AND CORONARY ANGIOGRAPHY N/A 10/02/2020  ? Procedure: LEFT HEART CATH AND CORONARY ANGIOGRAPHY;  Surgeon: Troy Sine, MD;  Location: [facility];  Service: Cardiovascular;  Laterality: N/A;  ? RIGHT HEART CATH N/A 10/02/2020  ? Procedure: RIGHT HEART CATH;  Surgeon: Troy Sine, MD;  Location: [facility];  Service: Cardiovascular;  Laterality: N/A;  ? ? ?  Current Medications: ?Outpatient Medications Prior to Visit  ?Medication Sig Dispense Refill  ? aspirin EC 81 MG tablet Take 1 tablet (81 mg total) by mouth daily. Swallow whole. 90 tablet 3  ? atenolol (TENORMIN) 100 MG tablet Take 100 mg by mouth daily.    ? fluticasone (FLONASE) 50 MCG/ACT nasal spray Place 2 sprays into both nostrils daily as needed for allergies or rhinitis. 27 mL 3  ? ibuprofen (ADVIL) 800 MG tablet TAKE 1 TABLET BY MOUTH EVERY 8 HOURS AS NEEDED FOR PAIN 270 tablet 0  ? nitroGLYCERIN (NITROSTAT) 0.4 MG SL  tablet Place 1 tablet (0.4 mg total) under the tongue every 5 (five) minutes as needed for chest pain. 90 tablet 3  ? omeprazole (PRILOSEC) 40 MG capsule Take 1 capsule by mouth once daily 90 capsule 0  ? PARoxetine (PAXIL) 10 MG tablet Take 1 tablet (10 mg total) by mouth daily. 90 tablet 1  ? rosuvastatin (CRESTOR) 10 MG tablet Take 1 tablet by mouth once daily 90 tablet 0  ? ?No facility-administered medications prior to visit.  ?  ? ?Allergies:   Codeine, Pseudoephedrine hcl, and Zyrtec [cetirizine]  ? ?Social History  ? ?Socioeconomic History  ? Marital status: Unknown  ?  Spouse name: Not on file  ? Number of children: Not on file  ? Years of education: Not on file  ? Highest education level: Not on file  ?Occupational History  ? Not on file  ?Tobacco Use  ? Smoking status: Every Day  ? Smokeless tobacco: Never  ? Tobacco comments:  ?  Vape pen with THC. Daily   ?Vaping Use  ? Vaping Use: Never used  ?Substance and Sexual Activity  ? Alcohol use: Never  ? Drug use: Never  ? Sexual activity: Not on file  ?Other Topics Concern  ? Not on file  ?Social History Narrative  ? Not on file  ? ?Social Determinants of Health  ? ?Financial Resource Strain: Not on file  ?Food Insecurity: Not on file  ?Transportation Needs: Not on file  ?Physical Activity: Not on file  ?Stress: Not on file  ?Social Connections: Not on file  ?  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  He is married for 18 years.  He has 3 children, 6 grandchildren.  Pleated 12th grade.  He has a previous tobacco use and quit October 2022.  He does vape occasionally.  His exercise consists of disc golf as well as regular golf. ? ?Family History:  The patient's family history includes Cancer in his father; Diabetes in his mother; Hypertension in his mother.  ?His mother is living at age 19 and has diabetes.  Father died of heart disease at age 50.  He has 2 living brothers ages 53 and 44.  His children are 23, 31 and 29. ? ?ROS ?General: Negative; No  fevers, chills, or night sweats;  ?HEENT: Negative; No changes in vision or hearing, sinus congestion, difficulty swallowing ?Pulmonary: Negative; No cough, wheezing, shortness of breath, hemoptysis ?Cardiovascular: See HPI ?GI: Negative; No nausea, vomiting, diarrhea, or abdominal pain ?GU: Negative; No dysuria, hematuria, or difficulty voiding ?Musculoskeletal: Negative; no myalgias, joint pain, or weakness ?Hematologic/Oncology: Negative; no easy bruising, bleeding ?Endocrine: Negative; no heat/cold intolerance; no diabetes ?Neuro: Negative; no changes in balance, headaches ?Skin: Negative; No rashes or skin lesions ?Psychiatric: Negative; No behavioral problems, depression ?Sleep: Excessive daytime sleepiness.  He typically goes to bed at 10 and wakes up at 4 AM sleeping only 6  hours.   ? ? ?Epworth Sleepiness Scale: ?Situation   Chance of Dozing/Sleeping (0 = never , 1 = slight chance , 2 = moderate chance , 3 = high chance )  ? sitting and reading 3  ? watching TV 3  ? sitting inactive in a public place 3  ? being a passenger in a motor vehicle for an hour or more 3  ? lying down in the afternoon 2  ? sitting and talking to someone 0  ? sitting quietly after lunch (no alcohol) 3  ? while stopped for a few minutes in traffic as the driver 0  ? Total Score  17  ?  ? ?Other comprehensive 14 point system review is negative. ? ? ?PHYSICAL EXAM:   ?VS:  BP (!) 136/96   Pulse 70   Ht 5' 7.5" (1.715 m)   Wt 176 lb (79.8 kg)   SpO2 95%   BMI 27.16 kg/m?    ? ?Repeat blood pressure by me was 140/90 initially, and on repeat 132/80 ? ?Wt Readings from Last 3 Encounters:  ?05/01/22 176 lb (79.8 kg)  ?03/15/22 176 lb (79.8 kg)  ?01/07/22 181 lb 3.2 oz (82.2 kg)  ?  ?General: Alert, oriented, no distress.  ?Skin: normal turgor, no rashes, warm and dry ?HEENT: Normocephalic, atraumatic. Pupils equal round and reactive to light; sclera anicteric; extraocular muscles intact; Fundi ** ?Nose without nasal septal  hypertrophy ?Mouth/Parynx benign; Mallinpatti scale 3 ?Neck: No JVD, no carotid bruits; normal carotid upstroke ?Lungs: clear to ausculatation and percussion; no wheezing or rales ?Chest wall: without tenderness to pal

## 2022-05-03 LAB — CBC WITH DIFFERENTIAL/PLATELET
Basophils Absolute: 0.1 10*3/uL (ref 0.0–0.2)
Basos: 1 %
EOS (ABSOLUTE): 0.1 10*3/uL (ref 0.0–0.4)
Eos: 2 %
Hematocrit: 45.7 % (ref 37.5–51.0)
Hemoglobin: 15.2 g/dL (ref 13.0–17.7)
Immature Grans (Abs): 0 10*3/uL (ref 0.0–0.1)
Immature Granulocytes: 0 %
Lymphocytes Absolute: 2 10*3/uL (ref 0.7–3.1)
Lymphs: 35 %
MCH: 28.6 pg (ref 26.6–33.0)
MCHC: 33.3 g/dL (ref 31.5–35.7)
MCV: 86 fL (ref 79–97)
Monocytes Absolute: 0.3 10*3/uL (ref 0.1–0.9)
Monocytes: 6 %
Neutrophils Absolute: 3.1 10*3/uL (ref 1.4–7.0)
Neutrophils: 56 %
Platelets: 172 10*3/uL (ref 150–450)
RBC: 5.32 x10E6/uL (ref 4.14–5.80)
RDW: 12.8 % (ref 11.6–15.4)
WBC: 5.6 10*3/uL (ref 3.4–10.8)

## 2022-05-03 LAB — BASIC METABOLIC PANEL
BUN/Creatinine Ratio: 17 (ref 9–20)
BUN: 18 mg/dL (ref 6–24)
CO2: 26 mmol/L (ref 20–29)
Calcium: 9.4 mg/dL (ref 8.7–10.2)
Chloride: 99 mmol/L (ref 96–106)
Creatinine, Ser: 1.04 mg/dL (ref 0.76–1.27)
Glucose: 98 mg/dL (ref 70–99)
Potassium: 4.7 mmol/L (ref 3.5–5.2)
Sodium: 139 mmol/L (ref 134–144)
eGFR: 83 mL/min/{1.73_m2} (ref >59)

## 2022-05-03 LAB — PT AND PTT
INR: 1 (ref 0.9–1.2)
Prothrombin Time: 10.4 s (ref 9.1–12.0)
aPTT: 31 s (ref 24–33)

## 2022-05-06 ENCOUNTER — Telehealth: Payer: Self-pay | Admitting: *Deleted

## 2022-05-06 NOTE — Telephone Encounter (Signed)
Cardiac Catheterization scheduled at [facility] for: Tuesday May 07, 2022 7:30 AM ?Arrival time and place: [place] Entrance A at: 5:30 AM ? ? ?Nothing to eat after midnight prior to procedure, clear liquids until 5 AM day of procedure. ? ?Medication instructions: ?-Usual morning medications can be taken with sips of water including aspirin 81 mg. ? ?Confirmed patient has responsible adult to drive home post procedure and be with patient first 24 hours after arriving home. ? ?Patient reports no new symptoms concerning for COVID-19/no exposure to COVID-19 in the past 10 days. ? ?Reviewed procedure instructions with patient's wife (DPR).  ?

## 2022-05-07 ENCOUNTER — Encounter: Payer: Self-pay | Admitting: Cardiovascular Disease

## 2022-05-07 ENCOUNTER — Other Ambulatory Visit: Payer: Self-pay

## 2022-05-07 ENCOUNTER — Encounter (HOSPITAL_COMMUNITY)
Admission: RE | Disposition: A | Discharge status: Home / Self Care | Payer: Self-pay | Source: Home / Self Care | Attending: Cardiovascular Disease

## 2022-05-07 ENCOUNTER — Ambulatory Visit (HOSPITAL_COMMUNITY)
Admission: RE | Admit: 2022-05-07 | Discharge: 2022-05-07 | Disposition: A | Discharge status: Home / Self Care | Payer: 59 | Attending: Cardiovascular Disease | Admitting: Cardiovascular Disease

## 2022-05-07 DIAGNOSIS — Q231 Congenital insufficiency of aortic valve: Secondary | ICD-10-CM

## 2022-05-07 DIAGNOSIS — I350 Nonrheumatic aortic (valve) stenosis: Secondary | ICD-10-CM | POA: Diagnosis not present

## 2022-05-07 DIAGNOSIS — I2510 Atherosclerotic heart disease of native coronary artery without angina pectoris: Secondary | ICD-10-CM | POA: Insufficient documentation

## 2022-05-07 DIAGNOSIS — Q2381 Bicuspid aortic valve: Secondary | ICD-10-CM

## 2022-05-07 DIAGNOSIS — I10 Essential (primary) hypertension: Secondary | ICD-10-CM

## 2022-05-07 DIAGNOSIS — Z01812 Encounter for preprocedural laboratory examination: Secondary | ICD-10-CM

## 2022-05-07 HISTORY — PX: RIGHT HEART CATH AND CORONARY ANGIOGRAPHY: CATH118264

## 2022-05-07 LAB — POCT I-STAT EG7
Acid-Base Excess: 2 mmol/L (ref 0.0–2.0)
Acid-Base Excess: 3 mmol/L — ABNORMAL HIGH (ref 0.0–2.0)
Bicarbonate: 28.2 mmol/L — ABNORMAL HIGH (ref 20.0–28.0)
Bicarbonate: 28.7 mmol/L — ABNORMAL HIGH (ref 20.0–28.0)
Calcium, Ion: 1.21 mmol/L (ref 1.15–1.40)
Calcium, Ion: 1.24 mmol/L (ref 1.15–1.40)
HCT: 41 % (ref 39.0–52.0)
HCT: 45 % (ref 39.0–52.0)
Hemoglobin: 13.9 g/dL (ref 13.0–17.0)
Hemoglobin: 15.3 g/dL (ref 13.0–17.0)
O2 Saturation: 68 %
O2 Saturation: 71 %
Potassium: 4.3 mmol/L (ref 3.5–5.1)
Potassium: 4.5 mmol/L (ref 3.5–5.1)
Sodium: 139 mmol/L (ref 135–145)
Sodium: 139 mmol/L (ref 135–145)
TCO2: 30 mmol/L (ref 22–32)
TCO2: 30 mmol/L (ref 22–32)
pCO2, Ven: 48.6 mmHg (ref 44–60)
pCO2, Ven: 48.6 mmHg (ref 44–60)
pH, Ven: 7.372 (ref 7.25–7.43)
pH, Ven: 7.379 (ref 7.25–7.43)
pO2, Ven: 37 mmHg (ref 32–45)
pO2, Ven: 39 mmHg (ref 32–45)

## 2022-05-07 LAB — POCT I-STAT 7, (LYTES, BLD GAS, ICA,H+H)
Acid-Base Excess: 1 mmol/L (ref 0.0–2.0)
Bicarbonate: 26 mmol/L (ref 20.0–28.0)
Calcium, Ion: 1.09 mmol/L — ABNORMAL LOW (ref 1.15–1.40)
HCT: 42 % (ref 39.0–52.0)
Hemoglobin: 14.3 g/dL (ref 13.0–17.0)
O2 Saturation: 96 %
Potassium: 4.1 mmol/L (ref 3.5–5.1)
Sodium: 141 mmol/L (ref 135–145)
TCO2: 27 mmol/L (ref 22–32)
pCO2 arterial: 41.8 mmHg (ref 32–48)
pH, Arterial: 7.402 (ref 7.35–7.45)
pO2, Arterial: 80 mmHg — ABNORMAL LOW (ref 83–108)

## 2022-05-07 SURGERY — RIGHT HEART CATH AND CORONARY ANGIOGRAPHY
Anesthesia: LOCAL

## 2022-05-07 MED ORDER — HEPARIN (PORCINE) IN NACL 1000-0.9 UT/500ML-% IV SOLN
INTRAVENOUS | Status: AC
  Filled 2022-05-07: qty 1000

## 2022-05-07 MED ORDER — HEPARIN (PORCINE) IN NACL 1000-0.9 UT/500ML-% IV SOLN
INTRAVENOUS | Status: DC | PRN
  Administered 2022-05-07 (×2): 500 mL

## 2022-05-07 MED ORDER — HYDRALAZINE HCL 20 MG/ML IJ SOLN: 10.0000 mg | INTRAMUSCULAR | Status: DC | PRN

## 2022-05-07 MED ORDER — HYDRALAZINE HCL 25 MG PO TABS
25.0000 mg | ORAL_TABLET | Freq: Once | ORAL | Status: DC
  Filled 2022-05-07: qty 1

## 2022-05-07 MED ORDER — VERAPAMIL HCL 2.5 MG/ML IV SOLN
INTRAVENOUS | Status: AC
  Filled 2022-05-07: qty 2

## 2022-05-07 MED ORDER — LIDOCAINE HCL (PF) 1 % IJ SOLN
INTRAMUSCULAR | Status: AC
  Filled 2022-05-07: qty 30

## 2022-05-07 MED ORDER — ACETAMINOPHEN 325 MG PO TABS: 650.0000 mg | ORAL_TABLET | ORAL | Status: DC | PRN

## 2022-05-07 MED ORDER — FENTANYL CITRATE (PF) 100 MCG/2ML IJ SOLN
INTRAMUSCULAR | Status: AC
  Filled 2022-05-07: qty 2

## 2022-05-07 MED ORDER — SODIUM CHLORIDE 0.9 % IV SOLN: 250.0000 mL | INTRAVENOUS | Status: DC | PRN

## 2022-05-07 MED ORDER — SODIUM CHLORIDE 0.9 % WEIGHT BASED INFUSION
3.0000 mL/kg/h | INTRAVENOUS | Status: AC
  Administered 2022-05-07: 3 mL/kg/h via INTRAVENOUS

## 2022-05-07 MED ORDER — ASPIRIN 81 MG PO CHEW: 81.0000 mg | CHEWABLE_TABLET | Freq: Every day | ORAL | Status: DC

## 2022-05-07 MED ORDER — MIDAZOLAM HCL 2 MG/2ML IJ SOLN
INTRAMUSCULAR | Status: AC
  Filled 2022-05-07: qty 2

## 2022-05-07 MED ORDER — SODIUM CHLORIDE 0.9 % WEIGHT BASED INFUSION: 1.0000 mL/kg/h | INTRAVENOUS | Status: DC

## 2022-05-07 MED ORDER — VERAPAMIL HCL 2.5 MG/ML IV SOLN
INTRAVENOUS | Status: DC | PRN
  Administered 2022-05-07: 10 mL via INTRA_ARTERIAL

## 2022-05-07 MED ORDER — SODIUM CHLORIDE 0.9% FLUSH: 3.0000 mL | INTRAVENOUS | Status: DC | PRN

## 2022-05-07 MED ORDER — LABETALOL HCL 5 MG/ML IV SOLN: 10.0000 mg | INTRAVENOUS | Status: DC | PRN

## 2022-05-07 MED ORDER — ONDANSETRON HCL 4 MG/2ML IJ SOLN: 4.0000 mg | Freq: Four times a day (QID) | INTRAMUSCULAR | Status: DC | PRN

## 2022-05-07 MED ORDER — HEPARIN SODIUM (PORCINE) 1000 UNIT/ML IJ SOLN
INTRAMUSCULAR | Status: AC
  Filled 2022-05-07: qty 10

## 2022-05-07 MED ORDER — HYDRALAZINE HCL 10 MG PO TABS
10.0000 mg | ORAL_TABLET | Freq: Once | ORAL | Status: AC
  Administered 2022-05-07: 10 mg via ORAL
  Filled 2022-05-07: qty 1

## 2022-05-07 MED ORDER — MIDAZOLAM HCL 2 MG/2ML IJ SOLN
INTRAMUSCULAR | Status: DC | PRN
  Administered 2022-05-07 (×2): 1 mg via INTRAVENOUS

## 2022-05-07 MED ORDER — IOHEXOL 350 MG/ML SOLN
INTRAVENOUS | Status: DC | PRN
  Administered 2022-05-07: 110 mL

## 2022-05-07 MED ORDER — SODIUM CHLORIDE 0.9% FLUSH: 3.0000 mL | Freq: Two times a day (BID) | INTRAVENOUS | Status: DC

## 2022-05-07 MED ORDER — FENTANYL CITRATE (PF) 100 MCG/2ML IJ SOLN
INTRAMUSCULAR | Status: DC | PRN
  Administered 2022-05-07: 25 ug via INTRAVENOUS

## 2022-05-07 MED ORDER — LIDOCAINE HCL (PF) 1 % IJ SOLN
INTRAMUSCULAR | Status: DC | PRN
  Administered 2022-05-07: 5 mL

## 2022-05-07 MED ORDER — DIAZEPAM 5 MG PO TABS: 5.0000 mg | ORAL_TABLET | Freq: Four times a day (QID) | ORAL | Status: DC | PRN

## 2022-05-07 MED ORDER — SODIUM CHLORIDE 0.9 % IV SOLN: INTRAVENOUS | Status: DC

## 2022-05-07 MED ORDER — HEPARIN SODIUM (PORCINE) 1000 UNIT/ML IJ SOLN
INTRAMUSCULAR | Status: DC | PRN
  Administered 2022-05-07: 4000 [IU] via INTRAVENOUS

## 2022-05-07 SURGICAL SUPPLY — 16 items
CATH BALLN WEDGE 5F 110CM (CATHETERS) ×1 IMPLANT
CATH INFINITI 5FR AL1 (CATHETERS) ×1 IMPLANT
CATH INFINITI JR4 5F (CATHETERS) ×1 IMPLANT
CATH OPTITORQUE TIG 4.0 5F (CATHETERS) ×1 IMPLANT
CATH VISTA GUIDE 6FR JR4 (CATHETERS) IMPLANT
DEVICE RAD COMP TR BAND LRG (VASCULAR PRODUCTS) ×1 IMPLANT
GLIDESHEATH SLEND SS 6F .021 (SHEATH) ×1 IMPLANT
GUIDEWIRE INQWIRE 1.5J.035X260 (WIRE) IMPLANT
INQWIRE 1.5J .035X260CM (WIRE) ×2
KIT HEART LEFT (KITS) ×2 IMPLANT
PACK CARDIAC CATHETERIZATION (CUSTOM PROCEDURE TRAY) ×2 IMPLANT
SHEATH GLIDE SLENDER 4/5FR (SHEATH) ×1 IMPLANT
SHEATH PROBE COVER 6X72 (BAG) ×1 IMPLANT
TRANSDUCER W/STOPCOCK (MISCELLANEOUS) ×2 IMPLANT
TUBING CIL FLEX 10 FLL-RA (TUBING) ×2 IMPLANT
WIRE EMERALD ST .035X260CM (WIRE) ×1 IMPLANT

## 2022-05-07 NOTE — Progress Notes (Signed)
Spoke to [place] who answered page for Dr. Claiborne Billings- notified him that we need Med rec done. ?

## 2022-05-07 NOTE — Progress Notes (Addendum)
Patient and his wife stated they were ready to leave and was discharged to home. ?

## 2022-05-07 NOTE — Progress Notes (Signed)
Per Dr. Claiborne Billings patient will continue same medicines, no changes. Dr. Claiborne Billings stated he will reconcile Med Rec . ?

## 2022-05-08 ENCOUNTER — Encounter (HOSPITAL_COMMUNITY): Payer: Self-pay | Admitting: Cardiovascular Disease

## 2022-05-17 ENCOUNTER — Encounter: Payer: 59 | Admitting: Surgery

## 2022-05-24 ENCOUNTER — Encounter: Payer: 59 | Admitting: Surgery

## 2022-05-29 ENCOUNTER — Other Ambulatory Visit: Payer: Self-pay | Admitting: Surgery

## 2022-05-29 ENCOUNTER — Institutional Professional Consult (permissible substitution): Payer: 59 | Admitting: Surgery

## 2022-05-29 ENCOUNTER — Encounter: Payer: Self-pay | Admitting: Surgery

## 2022-05-29 VITALS — BP 150/88 | HR 63 | Resp 20 | Ht 67.0 in | Wt 175.0 lb

## 2022-05-29 DIAGNOSIS — I350 Nonrheumatic aortic (valve) stenosis: Secondary | ICD-10-CM | POA: Diagnosis not present

## 2022-05-29 NOTE — Progress Notes (Signed)
PCP is Marge Duncans, PA-C Referring Provider is Troy Sine, MD  Chief Complaint  Patient presents with   Aortic Stenosis    Initial surgical consult, ECHO 4/18, cath 5/16    HPI:  The patient is a 56 year old gentleman with history of hypertension, hyperlipidemia, previous smoking, and bicuspid aortic valve disease that has been followed by cardiology with echocardiogram.  He was evaluated in October 2021 by Dr. Geraldo Pitter for episodes of chest discomfort.  He was referred to Dr. Claiborne Billings and underwent cardiac catheterization suggesting aortic stenosis.  The mean gradient was measured at 42 mmHg with a peak to peak gradient of 43 mmHg.  He had mild nonobstructive coronary disease with a 20% stenosis in the LAD and left circumflex marginal and RCA.  His echo in October 2021 showed moderate calcification and thickening of the aortic valve with a mean gradient of 30 mmHg and a peak gradient of 50 mmHg.  Aortic valve area by VTI was 0.63 cm.  The ascending aorta was measured at 3.8 cm.  Left ventricular ejection fraction was 60 to 65% with mild concentric LVH.  A follow-up echocardiogram in October 2022 showed bicuspid aortic valve with a mean gradient that had increased to 38.6 mmHg.  The ascending aorta was measured at 3.9 cm.  There was normal left ventricular systolic function with mild concentric LVH and grade 2 diastolic dysfunction.  He had a myocardial amyloid scan done in November 2022 which was equivocal with no definite evidence of amyloid.  He has continued to remain active and plays disc golf but has noticed exertional shortness of breath and fatigue with walking up hills.  He has some left-sided chest discomfort.  He denies any dizziness or syncope.  He has had no peripheral edema or orthopnea.  A follow-up echo on 04/09/2022 showed a mean gradient across aortic valve of 42 mmHg with peak gradient of 76 mmHg.  Left ventricular ejection fraction 65 to 70% with moderate LVH.  The ascending  aorta is measured at 40 mm.  He is here today with his wife.  He works in Armed forces technical officer work.  He smoked until last September when he quit. Past Medical History:  Diagnosis Date   Essential hypertension 09/19/2020   Hyperlipidemia    Hypertension    Migraine    Mixed hyperlipidemia 09/19/2020   Other chest pain 09/19/2020   Other fatigue 09/19/2020   Skin cancer of arm, right     Past Surgical History:  Procedure Laterality Date   APPENDECTOMY     LEFT HEART CATH AND CORONARY ANGIOGRAPHY N/A 10/02/2020   Procedure: LEFT HEART CATH AND CORONARY ANGIOGRAPHY;  Surgeon: Troy Sine, MD;  Location: [facility];  Service: Cardiovascular;  Laterality: N/A;   RIGHT HEART CATH N/A 10/02/2020   Procedure: RIGHT HEART CATH;  Surgeon: Troy Sine, MD;  Location: [facility];  Service: Cardiovascular;  Laterality: N/A;   RIGHT HEART CATH AND CORONARY ANGIOGRAPHY N/A 05/07/2022   Procedure: RIGHT HEART CATH AND CORONARY ANGIOGRAPHY;  Surgeon: Troy Sine, MD;  Location: [facility];  Service: Cardiovascular;  Laterality: N/A;    Family History  Problem Relation Age of Onset   Diabetes Mother    Hypertension Mother    Cancer Father     Social History Social History   Tobacco Use   Smoking status: Every Day   Smokeless tobacco: Never   Tobacco comments:    Vape  pen with THC. Daily   Vaping Use   Vaping Use: Never used  Substance Use Topics   Alcohol use: Never   Drug use: Never    Current Outpatient Medications  Medication Sig Dispense Refill   aspirin EC 81 MG tablet Take 1 tablet (81 mg total) by mouth daily. Swallow whole. 90 tablet 3   atenolol (TENORMIN) 50 MG tablet Take 50 mg by mouth daily.     cetirizine-pseudoephedrine (ZYRTEC-D) 5-120 MG tablet Take 1 tablet by mouth daily as needed for allergies.     fluticasone (FLONASE) 50 MCG/ACT nasal spray Place 2 sprays into both nostrils daily as needed for allergies or  rhinitis. 27 mL 3   ibuprofen (ADVIL) 800 MG tablet TAKE 1 TABLET BY MOUTH EVERY 8 HOURS AS NEEDED FOR PAIN 270 tablet 0   omeprazole (PRILOSEC) 40 MG capsule Take 1 capsule by mouth once daily 90 capsule 0   PARoxetine (PAXIL) 10 MG tablet Take 1 tablet (10 mg total) by mouth daily. 90 tablet 1   rosuvastatin (CRESTOR) 10 MG tablet Take 1 tablet by mouth once daily 90 tablet 0   nitroGLYCERIN (NITROSTAT) 0.4 MG SL tablet Place 1 tablet (0.4 mg total) under the tongue every 5 (five) minutes as needed for chest pain. 90 tablet 3   No current facility-administered medications for this visit.    Allergies  Allergen Reactions   Codeine Rash    Review of Systems  Constitutional:  Positive for fatigue. Negative for appetite change.  HENT: Negative.         Sees his dentist regularly and has had multiple teeth pulled in past.  Eyes: Negative.   Respiratory:  Positive for chest tightness and shortness of breath.   Cardiovascular:  Positive for chest pain. Negative for palpitations and leg swelling.  Gastrointestinal: Negative.   Endocrine: Negative.   Genitourinary: Negative.   Musculoskeletal:  Positive for arthralgias and myalgias.  Skin: Negative.   Allergic/Immunologic: Negative.   Neurological:  Negative for dizziness and syncope.  Hematological: Negative.    BP (!) 150/88 (BP Location: Left Arm, Patient Position: Sitting)   Pulse 63   Resp 20   Ht _0  (1.702 m)   Wt 175 lb (79.4 kg)   SpO2 98% Comment: RA  BMI 27.41 kg/m  Physical Exam Constitutional:      Appearance: Normal appearance. He is normal weight.  HENT:     Head: Normocephalic.     Mouth/Throat:     Mouth: Mucous membranes are moist.     Pharynx: Oropharynx is clear.  Eyes:     Extraocular Movements: Extraocular movements intact.     Conjunctiva/sclera: Conjunctivae normal.     Pupils: Pupils are equal, round, and reactive to light.  Cardiovascular:     Rate and Rhythm: Normal rate and regular rhythm.      Pulses: Normal pulses.     Heart sounds: Murmur heard.     Comments: 3/6 systolic murmur RSB, no diastolic murmur Pulmonary:     Effort: Pulmonary effort is normal.     Breath sounds: Normal breath sounds.  Abdominal:     General: Abdomen is flat. Bowel sounds are normal. There is no distension.     Palpations: Abdomen is soft.     Tenderness: There is no abdominal tenderness.  Musculoskeletal:        General: No swelling. Normal range of motion.     Cervical back: Normal range of motion and neck supple.  Skin:  General: Skin is warm and dry.  Neurological:     General: No focal deficit present.     Mental Status: He is alert and oriented to person, place, and time.  Psychiatric:        Mood and Affect: Mood normal.        Behavior: Behavior normal.     Diagnostic Tests:    ECHOCARDIOGRAM REPORT         Patient Name:   Darren Lyons Date of Exam: 04/09/2022  Medical Rec #:  [id number]    Height:       67.5 in  Accession #:    [id number]   Weight:       176.0 lb  Date of Birth:  [birth year]    BSA:          1.926 m  Patient Age:    54 years     BP:           140/80 mmHg  Patient Gender: M            HR:           64 bpm.  Exam Location:  Outpatient   Procedure: 2D Echo, Cardiac Doppler and Color Doppler   Indications:    I35.0 Nonrheumatic aortic (valve) stenosis; R06.9 DOE     History:        Patient has prior history of Echocardiogram examinations,  most                  recent 10/12/2021. CAD, Aortic Valve Disease,                  Signs/Symptoms:Dyspnea; Risk Factors:Hypertension,  Dyslipidemia                  and Current Smoker. Patient denies chest pain and leg  edema. He                  does have DOE. Patient contracted [employer] 01/2021.     Sonographer:    Salvadore Dom RVT, RDCS (AE), RDMS  Referring Phys: [phone] CAITLIN S WALKER      Sonographer Comments: Global longitudinal strain was attempted.  IMPRESSIONS     1. Left ventricular ejection  fraction, by estimation, is 65 to 70%. The  left ventricle has normal function. The left ventricle has no regional  wall motion abnormalities. There is moderate left ventricular hypertrophy.  Left ventricular diastolic  parameters are indeterminate.   2. Right ventricular systolic function is normal. The right ventricular  size is normal.   3. Right atrial size was mildly dilated.   4. The mitral valve is degenerative. No evidence of mitral valve  regurgitation. No evidence of mitral stenosis. Moderate mitral annular  calcification.   5. Aortic dilatation noted. There is dilatation of the ascending aorta,  measuring 40 mm.   6. The inferior vena cava is normal in size with greater than 50%  respiratory variability, suggesting right atrial pressure of 3 mmHg.   7. The aortic valve is calcified. There is severe calcifcation of the  aortic valve. Aortic valve regurgitation is not visualized. Severe aortic  valve stenosis. Vmax 4.4 m/s, MG 42 mmHg, AVA 0.8 cm^2, DI 0.21   Comparison(s): EF 60%, mild LVH, mod MAC, mod AS mean 38.6 mmHg, peak 65.8  mmHg, asc aor 38 mm, GLS -14.3%.   FINDINGS   Left Ventricle: Left ventricular ejection fraction, by estimation, is 65  to 70%. The left  ventricle has normal function. The left ventricle has no  regional wall motion abnormalities. The left ventricular internal cavity  size was small. There is moderate   left ventricular hypertrophy. Left ventricular diastolic parameters are  indeterminate.   Right Ventricle: The right ventricular size is normal. No increase in  right ventricular wall thickness. Right ventricular systolic function is  normal.   Left Atrium: Left atrial size was normal in size.   Right Atrium: Right atrial size was mildly dilated.   Pericardium: There is no evidence of pericardial effusion.   Mitral Valve: The mitral valve is degenerative in appearance. Moderate  mitral annular calcification. No evidence of mitral valve  regurgitation.  No evidence of mitral valve stenosis.   Tricuspid Valve: The tricuspid valve is normal in structure. Tricuspid  valve regurgitation is trivial.   Aortic Valve: The aortic valve is calcified. There is severe calcifcation  of the aortic valve. Aortic valve regurgitation is not visualized. Severe  aortic stenosis is present. Aortic valve mean gradient measures 42.0 mmHg.  Aortic valve peak gradient  measures 75.7 mmHg. Aortic valve area, by VTI measures 0.80 cm.   Pulmonic Valve: The pulmonic valve was not well visualized. Pulmonic valve  regurgitation is not visualized.   Aorta: The aortic root is normal in size and structure and aortic  dilatation noted. There is dilatation of the ascending aorta, measuring 40  mm.   Venous: The inferior vena cava is normal in size with greater than 50%  respiratory variability, suggesting right atrial pressure of 3 mmHg.   IAS/Shunts: The interatrial septum was not well visualized.      LEFT VENTRICLE  PLAX 2D  LVIDd:         3.24 cm      Diastology  LVIDs:         2.08 cm      LV e' medial:    6.20 cm/s  LV PW:         1.55 cm      LV E/e' medial:  15.4  LV IVS:        1.28 cm      LV e' lateral:   7.62 cm/s  LVOT diam:     2.20 cm      LV E/e' lateral: 12.5  LV SV:         90  LV SV Index:   47  LVOT Area:     3.80 cm                                 3D Volume EF:  LV Volumes (MOD)            3D EF:        54 %  LV vol d, MOD A2C: 52.7 ml  LV EDV:       124 ml  LV vol d, MOD A4C: 115.0 ml LV ESV:       57 ml  LV vol s, MOD A2C: 21.0 ml  LV SV:        66 ml  LV vol s, MOD A4C: 32.1 ml  LV SV MOD A2C:     31.7 ml  LV SV MOD A4C:     115.0 ml  LV SV MOD BP:      55.0 ml   RIGHT VENTRICLE  RV S prime:     12.50 cm/s  TAPSE (M-mode): 2.0  cm   LEFT ATRIUM             Index        RIGHT ATRIUM           Index  LA diam:        3.20 cm 1.66 cm/m   RA Area:     20.00 cm  LA Vol (A2C):   55.0 ml 28.56 ml/m  RA Volume:    64.80 ml  33.65 ml/m  LA Vol (A4C):   55.3 ml 28.71 ml/m  LA Biplane Vol: 56.2 ml 29.18 ml/m   AORTIC VALVE                     PULMONIC VALVE  AV Area (Vmax):    0.82 cm      PV Vmax:       1.03 m/s  AV Area (Vmean):   0.88 cm      PV Peak grad:  4.2 mmHg  AV Area (VTI):     0.80 cm  AV Vmax:           435.00 cm/s  AV Vmean:          297.000 cm/s  AV VTI:            1.130 m  AV Peak Grad:      75.7 mmHg  AV Mean Grad:      42.0 mmHg  LVOT Vmax:         93.50 cm/s  LVOT Vmean:        69.100 cm/s  LVOT VTI:          0.238 m  LVOT/AV VTI ratio: 0.21     AORTA  Ao Root diam: 3.30 cm  Ao Arch diam: 3.2 cm   MITRAL VALVE               TRICUSPID VALVE  MV Area (PHT): 3.43 cm    TR Peak grad:   7.4 mmHg  MV Decel Time: 221 msec    TR Vmax:        136.00 cm/s  MV E velocity: 95.20 cm/s  MV A velocity: 82.10 cm/s  SHUNTS  MV E/A ratio:  1.16        Systemic VTI:  0.24 m                             Systemic Diam: 2.20 cm   Oswaldo Milian MD  Electronically signed by Oswaldo Milian MD  Signature Date/Time: 04/09/2022/3:03:15 PM       Physicians  Panel Physicians Referring Physician Case Authorizing Physician  Troy Sine, MD (Primary)     Procedures  RIGHT HEART CATH AND CORONARY ANGIOGRAPHY   Conclusion      Prox LAD lesion is 20% stenosed.   2nd Mrg lesion is 70% stenosed.   Prox RCA lesion is 20% stenosed.   There is severe aortic valve stenosis.   Mild nonobstructive CAD with 20% proximal LAD and mid RCA stenoses.  There is focal 70% stenosis in the OM vessel which is very small caliber.   Mildly increased right heart pressures with PA pressure 38/13; mean 20.   Severely calcified aortic valve with reduced excursion.  The valve was not crossed and on most recent echo Doppler study from April 2023, mean gradient 42 with peak instantaneous gradient 75.7 millimeters mercury and aortic valve area at 0.8 cm.  RECOMMENDATION: The patient will be  referred to Dr. Gilford Raid for surgical evaluation of his aortic stenosis.   Indications  Aortic stenosis due to bicuspid aortic valve [Q23.0, Q23.1 (ICD-10-CM)]   Procedural Details  Technical Details Mr. Darren Lyons is a 56 year old gentleman who had been previously evaluated by Dr. Geraldo Pitter and had undergone cardiac catheterization in October 2021 and was found to have nonobstructive CAD with 20% stenoses in the LAD, OM 2, and RCA.  He was found to have severe aortic valve stenosis at catheterization.  He has subsequently been followed and an echo Doppler study in October 2022 showed a mean gradient of 38.  He had recently been seen by Laurann Montana, NP and repeat echo Doppler study from April 2023 has shown progressive aortic stenosis with a mean gradient of 42 and peak gradient of 75.7 mm.  The patient denies any significant chest pain but does admit to more fatigue.  He does experience some shortness of breath with uphill walking particularly when playing disc golf.  I saw him for my initial office evaluation last week and definitive repeat cardiac catheterization was recommended.  The patient arrived to the catheterization laboratory in the fasting state.  He was initially premedicated with Versed 1 mg and fentanyl 25 mcg but received additional Versed 1 mg and fentanyl 25 mcg for improved sedation.  An IV had previously been placed in his right brachial vein.  This was exchanged for a 5 [place] Glidesheath slender sheath.  Right heart catheter was inserted and advanced to the RA, RV, PA, and pulmonary capillary wedge positions.  Oxygen saturation was obtained x2 in the pulmonary artery.  Ultrasound guidance was used for right radial artery access. The right radial artery was punctured via the Seldinger technique, and a 6 [place] Glidesheath Slender was inserted without difficulty.  A radial cocktail consisting of Verapamil 3 mg was administered. The patient received weight adjusted heparin. A  safety J wire was advanced into the ascending aorta. Diagnostic catheterization was done with a 5 [place] TIG 4.0 catheters.  A brief attempt trying to cross the aortic valve with a JR4 and AL-1 catheter was unsuccessful and since the stenosis was felt to be severe on echo no further attempt was made.  All catheters were removed from the patient.  A TR radial band was applied for hemostasis to the radial artery site.  Manual hemostasis was applied for the brachial site. The patient left the catheterization laboratory in stable condition.     Estimated blood loss <50 mL.   During this procedure medications were administered to achieve and maintain moderate conscious sedation while the patient's heart rate, blood pressure, and oxygen saturation were continuously monitored and I was present face-to-face 100% of this time.   Medications (Filter: Administrations occurring from [phone] to 1016 on 05/07/22) Heparin (Porcine) in NaCl 1000-0.9 UT/500ML-% SOLN (mL) Total volume:  1,000 mL Date/Time Rate/Dose/Volume Action   05/07/22 0859 500 mL Given   0859 500 mL Given    fentaNYL (SUBLIMAZE) injection (mcg) Total dose:  25 mcg Date/Time Rate/Dose/Volume Action   05/07/22 0909 25 mcg Given    midazolam (VERSED) injection (mg) Total dose:  2 mg Date/Time Rate/Dose/Volume Action   05/07/22 0909 1 mg Given   0932 1 mg Given    lidocaine (PF) (XYLOCAINE) 1 % injection (mL) Total volume:  5 mL Date/Time Rate/Dose/Volume Action   05/07/22 0926 5 mL Given    Radial Cocktail/Verapamil only (mL) Total volume:  10 mL  Date/Time Rate/Dose/Volume Action   05/07/22 0935 10 mL Given    heparin sodium (porcine) injection (Units) Total dose:  4,000 Units Date/Time Rate/Dose/Volume Action   05/07/22 0938 4,000 Units Given    iohexol (OMNIPAQUE) 350 MG/ML injection (mL) Total volume:  110 mL Date/Time Rate/Dose/Volume Action   05/07/22 1009 110 mL Given    Sedation Time  Sedation Time Physician-1: 57  minutes 17 seconds Contrast  Medication Name Total Dose  iohexol (OMNIPAQUE) 350 MG/ML injection 110 mL   Radiation/Fluoro  Fluoro time: 14.3 (min) DAP: 17.5 (Gycm2) Cumulative Air Kerma: 161.0 (mGy) Complications  Complications documented before study signed (05/07/2022  9:60 PM)   No complications were associated with this study.  Documented by Bard Herbert, RN - 05/07/2022 10:11 AM     Coronary Findings  Diagnostic Dominance: Right Left Anterior Descending  Prox LAD lesion is 20% stenosed.    Left Circumflex    First Obtuse Marginal Branch  Vessel is small in size.    Second Obtuse Marginal Branch  Vessel is small in size.  2nd Mrg lesion is 70% stenosed.    Right Coronary Artery  Prox RCA lesion is 20% stenosed.    Intervention   No interventions have been documented.   Right Heart  Right Atrium RA: A-wave 8, V wave 6; mean 4 RV: 39/9 PA: 38/13; mean 20 PW: A-wave 23, V wave 19; mean 14  Ao :139/80  Oxygen saturation in the central aorta 96% and in the pulmonary artery 70%  By the Fick method, cardiac output 4.7 L/min with cardiac index 2.5 L/min/m.  PVR: 1.3 WU   Left Heart  Aortic Valve There is severe aortic valve stenosis. The aortic valve is calcified. There is restricted aortic valve motion.   Coronary Diagrams  Diagnostic Dominance: Right Intervention  Implants     No implant documentation for this case.   Syngo Images   Show images for CARDIAC CATHETERIZATION Images on Long Term Storage   Show images for Darren Lyons, Darren Lyons to Procedure Log  Procedure Log    Hemo Data  Flowsheet Row Most Recent Value  Fick Cardiac Output 4.72 L/min  Fick Cardiac Output Index 2.47 (L/min)/BSA  RA A Wave 8 mmHg  RA V Wave 6 mmHg  RA Mean 4 mmHg  RV Systolic Pressure 39 mmHg  RV Diastolic Pressure 1 mmHg  RV EDP 9 mmHg  PA Systolic Pressure 38 mmHg  PA Diastolic Pressure 13 mmHg  PA Mean 20 mmHg  PW A Wave 23 mmHg  PW V Wave 19  mmHg  PW Mean 14 mmHg  AO Systolic Pressure 454 mmHg  AO Diastolic Pressure 80 mmHg  AO Mean 103 mmHg  QP/QS 1  TPVR Index 8.08 HRUI  TSVR Index 41.63 HRUI  PVR SVR Ratio 0.06  TPVR/TSVR Ratio 0.19   Impression:  This 56 year old gentleman has stage D, severe, symptomatic, bicuspid aortic valve stenosis with New York Heart Association class II symptoms of exertional fatigue and shortness of breath consistent with chronic diastolic congestive heart failure.  His echocardiogram shows a calcified and thickened aortic valve with restricted mobility.  It looks bicuspid. The mean gradient has increased to 42 mmHg with a valve area of 0.8 cm by VTI. Cardiac catheterization shows mild nonobstructive disease in the LAD and mid RCA with a 70% proximal stenosis of a small second marginal branch.  I do not think this branch is large enough to graft.  I agree that aortic valve replacement is  indicated in this patient for relief of his lifestyle limiting symptoms and to prevent progressive left ventricular dysfunction.  He will require a gated cardiac CTA and CTA of the chest to evaluate the anatomy around his aortic valve and assess for aortic root and ascending aortic aneurysm.  Then he will require aortic valve replacement.  I discussed the alternatives of mechanical and bioprosthetic valves.  We discussed the pros and cons of both.  He would like to avoid Coumadin and use a bioprosthetic valve which I think is a reasonable option at his age.  I think his coronary disease will need to be managed medically.  He does have some intermittent left chest tightness and is not clear whether that is related to his severe aortic stenosis or his obtuse marginal stenosis although I suspect it is more likely to be due to his severe aortic stenosis. I discussed the operative procedure with the patient and his wife including alternatives, benefits and risks; including but not limited to bleeding, blood transfusion, infection,  stroke, myocardial infarction, graft failure, heart block requiring a permanent pacemaker, organ dysfunction, and death.  Darren Lyons understands and agrees to proceed.    Plan:  He will be scheduled for a gated cardiac CTA and CTA of the chest.  We have scheduled aortic valve replacement using a bioprosthetic valve on 06/17/2022.  I will plan to call him with the results of his CTA and if it changes our operative plan and I will see him back in the office to review it with him and discuss any changes.  I spent 60 minutes performing this consultation and > 50% of this time was spent face to face counseling and coordinating the care of this patient's severe symptomatic aortic stenosis.  Gaye Pollack, MD [facility] [phone]

## 2022-05-30 ENCOUNTER — Encounter: Payer: Self-pay | Admitting: *Deleted

## 2022-05-30 ENCOUNTER — Other Ambulatory Visit: Payer: Self-pay | Admitting: *Deleted

## 2022-05-30 DIAGNOSIS — I350 Nonrheumatic aortic (valve) stenosis: Secondary | ICD-10-CM

## 2022-06-10 ENCOUNTER — Ambulatory Visit (HOSPITAL_COMMUNITY)
Admission: RE | Admit: 2022-06-10 | Discharge: 2022-06-10 | Disposition: A | Discharge status: Home / Self Care | Payer: 59 | Source: Ambulatory Visit | Attending: Surgery | Admitting: Surgery

## 2022-06-10 DIAGNOSIS — I350 Nonrheumatic aortic (valve) stenosis: Secondary | ICD-10-CM | POA: Insufficient documentation

## 2022-06-10 MED ORDER — IOHEXOL 350 MG/ML SOLN
100.0000 mL | Freq: Once | INTRAVENOUS | Status: AC | PRN
  Administered 2022-06-10: 100 mL via INTRAVENOUS

## 2022-06-12 ENCOUNTER — Other Ambulatory Visit (HOSPITAL_BASED_OUTPATIENT_CLINIC_OR_DEPARTMENT_OTHER): Payer: Self-pay | Admitting: Family

## 2022-06-12 NOTE — Telephone Encounter (Signed)
Pt of Dr. Kelly. Please review for refill. Thank you! 

## 2022-06-12 NOTE — Progress Notes (Signed)
Surgical Instructions    Your procedure is scheduled on Monday, June 26th.  Report to [facility] Main Entrance "A" at 5:30 A.M., then check in with the Admitting office.  Call this number if you have problems the morning of surgery:  [phone]   If you have any questions prior to your surgery date call [phone]: Open Monday-Friday 8am-4pm    Remember:  Do not eat or drink after midnight the night before your surgery     Take these medicines the morning of surgery with A SIP OF WATER:   Atenolol (Tenormin)  Omeprazole (Prilosec)  Paroxetine (Paxil)  Rosuvastatin (Crestor)   If needed:  Flonase nasal spray  Nitroglycerin tablet  Follow your surgeon's instructions on when to stop Aspirin.  If no instructions were given by your surgeon then you will need to call the office to get those instructions.    As of today, STOP taking any Aspirin (unless otherwise instructed by your surgeon) Aleve, Naproxen, Ibuprofen, Motrin, Advil, Goody's, BC's, all herbal medications, fish oil, and all vitamins.           DAY OF SURGERY: Do not wear jewelry or makeup Do not wear lotions, powders, colognes, or deodorant. Men may shave face and neck. Do not bring valuables to the hospital.  [facility] is not responsible for any belongings or valuables. .   Do NOT Smoke (Tobacco/Vaping)  24 hours prior to your procedure  If you use a CPAP at night, you may bring your mask for your overnight stay.   Contacts, glasses, hearing aids, dentures or partials may not be worn into surgery, please bring cases for these belongings   For patients admitted to the hospital, discharge time will be determined by your treatment team.   Patients discharged the day of surgery will not be allowed to drive home, and someone needs to stay with them for 24 hours.   SURGICAL WAITING ROOM VISITATION Patients having surgery or a procedure in a hospital may have two support people. Children under the age of 56  must have an adult with them who is not the patient. They may stay in the waiting area during the procedure and may switch out with other visitors. If the patient needs to stay at the hospital during part of their recovery, the visitor guidelines for inpatient rooms apply.  Please refer to the [facility] website for the visitor guidelines for Inpatients (after your surgery is over and you are in a regular room).    Special instructions:    Oral Hygiene is also important to reduce your risk of infection.  Remember - BRUSH YOUR TEETH THE MORNING OF SURGERY WITH YOUR REGULAR TOOTHPASTE   [place]- Preparing For Surgery  Before surgery, you can play an important role. Because skin is not sterile, your skin needs to be as free of germs as possible. You can reduce the number of germs on your skin by washing with CHG (chlorahexidine gluconate) Soap before surgery.  CHG is an antiseptic cleaner which kills germs and bonds with the skin to continue killing germs even after washing.     Please do not use if you have an allergy to CHG or antibacterial soaps. If your skin becomes reddened/irritated stop using the CHG.  Do not shave (including legs and underarms) for at least 48 hours prior to first CHG shower. It is OK to shave your face.  Please follow these instructions carefully.     Shower the [employer] SURGERY and  the MORNING OF SURGERY with CHG Soap.   If you chose to wash your hair, wash your hair first as usual with your normal shampoo. After you shampoo, rinse your hair and body thoroughly to remove the shampoo.  Then [employer] and genitals (private parts) with your normal soap and rinse thoroughly to remove soap.  After that Use CHG Soap as you would any other liquid soap. You can apply CHG directly to the skin and wash gently with a scrungie or a clean washcloth.   Apply the CHG Soap to your body ONLY FROM THE NECK DOWN.  Do not use on open wounds or open sores. Avoid contact with  your eyes, ears, mouth and genitals (private parts). Wash Face and genitals (private parts)  with your normal soap.   Wash thoroughly, paying special attention to the area where your surgery will be performed.  Thoroughly rinse your body with warm water from the neck down.  DO NOT shower/wash with your normal soap after using and rinsing off the CHG Soap.  Pat yourself dry with a CLEAN TOWEL.  Wear CLEAN PAJAMAS to bed the night before surgery  Place CLEAN SHEETS on your bed the night before your surgery  DO NOT SLEEP WITH PETS.   Day of Surgery:  Take a shower with CHG soap. Wear Clean/Comfortable clothing the morning of surgery Do not apply any deodorants/lotions.   Remember to brush your teeth WITH YOUR REGULAR TOOTHPASTE.   If you received a COVID test during your pre-op visit, it is requested that you wear a mask when out in public, stay away from anyone that may not be feeling well, and notify your surgeon if you develop symptoms. If you have been in contact with anyone that has tested positive in the last 10 days, please notify your surgeon.    Please read over the following fact sheets that you were given.

## 2022-06-13 ENCOUNTER — Other Ambulatory Visit: Payer: Self-pay

## 2022-06-13 ENCOUNTER — Ambulatory Visit (HOSPITAL_COMMUNITY)
Admission: RE | Admit: 2022-06-13 | Discharge: 2022-06-13 | Disposition: A | Discharge status: Home / Self Care | Payer: 59 | Source: Ambulatory Visit | Attending: Surgery | Admitting: Surgery

## 2022-06-13 ENCOUNTER — Encounter (HOSPITAL_COMMUNITY)
Admission: RE | Admit: 2022-06-13 | Discharge: 2022-06-13 | Disposition: A | Discharge status: Home / Self Care | Payer: 59 | Source: Ambulatory Visit | Attending: Surgery | Admitting: Surgery

## 2022-06-13 ENCOUNTER — Other Ambulatory Visit: Payer: Self-pay | Admitting: Physician Assistant

## 2022-06-13 ENCOUNTER — Encounter (HOSPITAL_COMMUNITY): Payer: Self-pay

## 2022-06-13 VITALS — BP 130/85 | HR 57 | Temp 98.3°F | Resp 18 | Ht 67.0 in | Wt 175.5 lb

## 2022-06-13 DIAGNOSIS — Z20822 Contact with and (suspected) exposure to covid-19: Secondary | ICD-10-CM | POA: Insufficient documentation

## 2022-06-13 DIAGNOSIS — E782 Mixed hyperlipidemia: Secondary | ICD-10-CM

## 2022-06-13 DIAGNOSIS — Z01818 Encounter for other preprocedural examination: Secondary | ICD-10-CM | POA: Diagnosis present

## 2022-06-13 DIAGNOSIS — I10 Essential (primary) hypertension: Secondary | ICD-10-CM | POA: Diagnosis not present

## 2022-06-13 DIAGNOSIS — K219 Gastro-esophageal reflux disease without esophagitis: Secondary | ICD-10-CM

## 2022-06-13 DIAGNOSIS — I350 Nonrheumatic aortic (valve) stenosis: Secondary | ICD-10-CM

## 2022-06-13 DIAGNOSIS — E785 Hyperlipidemia, unspecified: Secondary | ICD-10-CM | POA: Diagnosis not present

## 2022-06-13 HISTORY — DX: Depression, unspecified: F32.A

## 2022-06-13 HISTORY — DX: Gastro-esophageal reflux disease without esophagitis: K21.9

## 2022-06-13 LAB — URINALYSIS, ROUTINE W REFLEX MICROSCOPIC
Bilirubin Urine: NEGATIVE
Glucose, UA: NEGATIVE mg/dL
Hgb urine dipstick: NEGATIVE
Ketones, ur: NEGATIVE mg/dL
Leukocytes,Ua: NEGATIVE
Nitrite: NEGATIVE
Protein, ur: NEGATIVE mg/dL
Specific Gravity, Urine: 1.017 (ref 1.005–1.030)
pH: 7 (ref 5.0–8.0)

## 2022-06-13 LAB — COMPREHENSIVE METABOLIC PANEL
ALT: 15 U/L (ref 0–44)
AST: 18 U/L (ref 15–41)
Albumin: 4.2 g/dL (ref 3.5–5.0)
Alkaline Phosphatase: 66 U/L (ref 38–126)
Anion gap: 9 (ref 5–15)
BUN: 15 mg/dL (ref 6–20)
CO2: 25 mmol/L (ref 22–32)
Calcium: 9.3 mg/dL (ref 8.9–10.3)
Chloride: 104 mmol/L (ref 98–111)
Creatinine, Ser: 1.14 mg/dL (ref 0.61–1.24)
GFR, Estimated: >60 mL/min (ref >60)
Glucose, Bld: 104 mg/dL — ABNORMAL HIGH (ref 70–99)
Potassium: 4.1 mmol/L (ref 3.5–5.1)
Sodium: 138 mmol/L (ref 135–145)
Total Bilirubin: 0.7 mg/dL (ref 0.3–1.2)
Total Protein: 6.6 g/dL (ref 6.5–8.1)

## 2022-06-13 LAB — BLOOD GAS, ARTERIAL
Acid-Base Excess: 4.2 mmol/L — ABNORMAL HIGH (ref 0.0–2.0)
Bicarbonate: 29.2 mmol/L — ABNORMAL HIGH (ref 20.0–28.0)
Drawn by: 58793
O2 Saturation: 98.6 %
Patient temperature: 37
pCO2 arterial: 44 mmHg (ref 32–48)
pH, Arterial: 7.43 (ref 7.35–7.45)
pO2, Arterial: 98 mmHg (ref 83–108)

## 2022-06-13 LAB — TYPE AND SCREEN
ABO/RH(D): O POS
Antibody Screen: NEGATIVE

## 2022-06-13 LAB — CBC
HCT: 42.3 % (ref 39.0–52.0)
Hemoglobin: 14.3 g/dL (ref 13.0–17.0)
MCH: 29.2 pg (ref 26.0–34.0)
MCHC: 33.8 g/dL (ref 30.0–36.0)
MCV: 86.3 fL (ref 80.0–100.0)
Platelets: 160 10*3/uL (ref 150–400)
RBC: 4.9 MIL/uL (ref 4.22–5.81)
RDW: 13.2 % (ref 11.5–15.5)
WBC: 7.1 10*3/uL (ref 4.0–10.5)
nRBC: 0 % (ref 0.0–0.2)

## 2022-06-13 LAB — ABO/RH: ABO/RH(D): O POS

## 2022-06-13 LAB — HEMOGLOBIN A1C
Hgb A1c MFr Bld: 5.4 % (ref 4.8–5.6)
Mean Plasma Glucose: 108.28 mg/dL

## 2022-06-13 LAB — SURGICAL PCR SCREEN
MRSA, PCR: NEGATIVE
Staphylococcus aureus: NEGATIVE

## 2022-06-13 LAB — SARS CORONAVIRUS 2 (TAT 6-24 HRS): SARS Coronavirus 2: NEGATIVE

## 2022-06-13 LAB — APTT: aPTT: 29 seconds (ref 24–36)

## 2022-06-13 LAB — PROTIME-INR
INR: 1 (ref 0.8–1.2)
Prothrombin Time: 12.7 seconds (ref 11.4–15.2)

## 2022-06-13 NOTE — Progress Notes (Signed)
Pre-AVR testing has been completed. Preliminary results can be found in CV Proc through chart review.   06/13/22 10:28 AM Darren Lyons RVT

## 2022-06-13 NOTE — Progress Notes (Signed)
PCP - Marge Duncans Cardiologist - Shelva Majestic   Chest x-ray - 06/13/22 EKG - 06/13/22 Stress Test -  ECHO -  Cardiac Cath - 10/02/20 & 05/07/22  Aspirin Instructions: follow your surgeon's instructions when to stop taking ASA   COVID TEST- 06/13/22   Anesthesia review: yes, heart history  Patient denies shortness of breath, fever, cough and chest pain at PAT appointment   All instructions explained to the patient, with a verbal understanding of the material. Patient agrees to go over the instructions while at home for a better understanding. Patient also instructed to self quarantine after being tested for COVID-19. The opportunity to ask questions was provided.

## 2022-06-14 MED ORDER — NITROGLYCERIN IN D5W 200-5 MCG/ML-% IV SOLN
2.0000 ug/min | INTRAVENOUS | Status: DC
  Filled 2022-06-14: qty 250

## 2022-06-14 MED ORDER — CEFAZOLIN SODIUM-DEXTROSE 2-4 GM/100ML-% IV SOLN
2.0000 g | INTRAVENOUS | Status: AC
  Administered 2022-06-17: 2 g via INTRAVENOUS
  Filled 2022-06-14: qty 100

## 2022-06-14 MED ORDER — DEXMEDETOMIDINE HCL IN NACL 400 MCG/100ML IV SOLN
0.1000 ug/kg/h | INTRAVENOUS | Status: AC
  Administered 2022-06-17: .5 ug/kg/h via INTRAVENOUS
  Filled 2022-06-14: qty 100

## 2022-06-14 MED ORDER — HEPARIN 30,000 UNITS/1000 ML (OHS) CELLSAVER SOLUTION
Status: DC
  Filled 2022-06-14: qty 1000

## 2022-06-14 MED ORDER — TRANEXAMIC ACID (OHS) BOLUS VIA INFUSION
15.0000 mg/kg | INTRAVENOUS | Status: AC
  Administered 2022-06-17: 991.5 mg via INTRAVENOUS
  Filled 2022-06-14: qty 992

## 2022-06-14 MED ORDER — POTASSIUM CHLORIDE 2 MEQ/ML IV SOLN
80.0000 meq | INTRAVENOUS | Status: DC
  Filled 2022-06-14: qty 40

## 2022-06-14 MED ORDER — MANNITOL 20 % IV SOLN
INTRAVENOUS | Status: DC
  Filled 2022-06-14: qty 13

## 2022-06-14 MED ORDER — NOREPINEPHRINE 4 MG/250ML-% IV SOLN
0.0000 ug/min | INTRAVENOUS | Status: DC
  Filled 2022-06-14: qty 250

## 2022-06-14 MED ORDER — TRANEXAMIC ACID (OHS) PUMP PRIME SOLUTION
2.0000 mg/kg | INTRAVENOUS | Status: DC
  Filled 2022-06-14: qty 1.59

## 2022-06-14 MED ORDER — EPINEPHRINE HCL 5 MG/250ML IV SOLN IN NS
0.0000 ug/min | INTRAVENOUS | Status: DC
  Filled 2022-06-14: qty 250

## 2022-06-14 MED ORDER — PLASMA-LYTE A IV SOLN
INTRAVENOUS | Status: DC
  Filled 2022-06-14: qty 2.5

## 2022-06-14 MED ORDER — MILRINONE LACTATE IN DEXTROSE 20-5 MG/100ML-% IV SOLN
0.3000 ug/kg/min | INTRAVENOUS | Status: DC
  Filled 2022-06-14: qty 100

## 2022-06-14 MED ORDER — PHENYLEPHRINE HCL-NACL 20-0.9 MG/250ML-% IV SOLN
30.0000 ug/min | INTRAVENOUS | Status: AC
  Administered 2022-06-17: 20 ug/min via INTRAVENOUS
  Filled 2022-06-14: qty 250

## 2022-06-14 MED ORDER — TRANEXAMIC ACID 1000 MG/10ML IV SOLN
1.5000 mg/kg/h | INTRAVENOUS | Status: AC
  Administered 2022-06-17: 1.5 mg/kg/h via INTRAVENOUS
  Filled 2022-06-14: qty 25

## 2022-06-14 MED ORDER — INSULIN REGULAR(HUMAN) IN NACL 100-0.9 UT/100ML-% IV SOLN
INTRAVENOUS | Status: AC
  Administered 2022-06-17: 1 [IU]/h via INTRAVENOUS
  Filled 2022-06-14: qty 100

## 2022-06-14 MED ORDER — VANCOMYCIN HCL 1250 MG/250ML IV SOLN
1250.0000 mg | INTRAVENOUS | Status: AC
  Administered 2022-06-17: 1250 mg via INTRAVENOUS
  Filled 2022-06-14: qty 250

## 2022-06-16 NOTE — Anesthesia Preprocedure Evaluation (Addendum)
Anesthesia Evaluation  Patient identified by MRN, date of birth, ID band Patient awake    Reviewed: Allergy & Precautions, NPO status , Patient's Chart, lab work & pertinent test results  History of Anesthesia Complications Negative for: history of anesthetic complications  Airway Mallampati: III  TM Distance: >3 FB Neck ROM: Full    Dental  (+) Dental Advisory Given   Pulmonary neg pulmonary ROS, former smoker,    Pulmonary exam normal        Cardiovascular hypertension, Pt. on home beta blockers AS  Rhythm:Regular Rate:Normal + Systolic murmurs Cardiac Cath Narrative . Prox LAD lesion is 20% stenosed. . 2nd Mrg lesion is 70% stenosed. . Prox RCA lesion is 20% stenosed. . There is severe aortic valve stenosis.  Mild nonobstructive CAD with 20% proximal LAD and mid RCA stenoses. There  is focal 70% stenosis in the OM vessel which is very small caliber.  Mildly increased right heart pressures with PA pressure 38/13; mean 20.  Severely calcified aortic valve with reduced excursion. The valve was not  crossed and on most recent echo Doppler study from April 2023, mean  gradient 42 with peak instantaneous gradient 75.7 millimeters mercury and  aortic valve area at 0.8 cm.  Cardiac CT IMPRESSION: 1. Heavily calcified and thickened aortic valve. 2. No associated overt aneurysmal dilatation of the thoracic aorta with the ascending thoracic aorta measuring up to 3.8 cm in estimated maximal diameter. Mild thoracic aortic atherosclerosis. 3. Top-normal heart size with suggestion of underlying left ventricular hypertrophy. 4. Coronary atherosclerosis. 5. Benign-appearing incidental hepatic cyst. Aortic Atherosclerosis (ICD10-I70.0).    Neuro/Psych PSYCHIATRIC DISORDERS Depression negative neurological ROS     GI/Hepatic Neg liver ROS, GERD  Medicated,  Endo/Other  negative endocrine ROS  Renal/GU negative  Renal ROS     Musculoskeletal negative musculoskeletal ROS (+)   Abdominal   Peds  Hematology negative hematology ROS (+)   Anesthesia Other Findings   Reproductive/Obstetrics                            Anesthesia Physical Anesthesia Plan  ASA: 3  Anesthesia Plan: General   Post-op Pain Management: Tylenol PO (pre-op)*   Induction: Intravenous  PONV Risk Score and Plan: 3 and Ondansetron, Dexamethasone and Midazolam  Airway Management Planned: Oral ETT  Additional Equipment: Arterial line, PA Cath and 3D TEE  Intra-op Plan:   Post-operative Plan: Post-operative intubation/ventilation  Informed Consent: I have reviewed the patients History and Physical, chart, labs and discussed the procedure including the risks, benefits and alternatives for the proposed anesthesia with the patient or authorized representative who has indicated his/her understanding and acceptance.     Dental advisory given  Plan Discussed with: Anesthesiologist and CRNA  Anesthesia Plan Comments:        Anesthesia Quick Evaluation

## 2022-06-17 ENCOUNTER — Inpatient Hospital Stay (HOSPITAL_COMMUNITY): Payer: 59

## 2022-06-17 ENCOUNTER — Inpatient Hospital Stay (HOSPITAL_COMMUNITY): Payer: 59 | Admitting: Physician Assistant

## 2022-06-17 ENCOUNTER — Encounter (HOSPITAL_COMMUNITY): Payer: Self-pay | Admitting: Surgery

## 2022-06-17 ENCOUNTER — Inpatient Hospital Stay (HOSPITAL_COMMUNITY): Payer: 59 | Admitting: General Practice

## 2022-06-17 ENCOUNTER — Encounter (HOSPITAL_COMMUNITY)
Admission: RE | Disposition: A | Discharge status: Home / Self Care | Payer: Self-pay | Source: Home / Self Care | Attending: Surgery

## 2022-06-17 ENCOUNTER — Other Ambulatory Visit: Payer: Self-pay

## 2022-06-17 ENCOUNTER — Inpatient Hospital Stay (HOSPITAL_COMMUNITY)
Admission: RE | Admit: 2022-06-17 | Discharge: 2022-06-21 | DRG: 221 | Disposition: A | Discharge status: Home / Self Care | Payer: 59 | Attending: Surgery | Admitting: Surgery

## 2022-06-17 DIAGNOSIS — Z85828 Personal history of other malignant neoplasm of skin: Secondary | ICD-10-CM | POA: Diagnosis not present

## 2022-06-17 DIAGNOSIS — Z8249 Family history of ischemic heart disease and other diseases of the circulatory system: Secondary | ICD-10-CM

## 2022-06-17 DIAGNOSIS — F17210 Nicotine dependence, cigarettes, uncomplicated: Secondary | ICD-10-CM | POA: Diagnosis present

## 2022-06-17 DIAGNOSIS — Z79899 Other long term (current) drug therapy: Secondary | ICD-10-CM | POA: Diagnosis not present

## 2022-06-17 DIAGNOSIS — K219 Gastro-esophageal reflux disease without esophagitis: Secondary | ICD-10-CM | POA: Diagnosis present

## 2022-06-17 DIAGNOSIS — Z952 Presence of prosthetic heart valve: Secondary | ICD-10-CM | POA: Diagnosis present

## 2022-06-17 DIAGNOSIS — I10 Essential (primary) hypertension: Secondary | ICD-10-CM | POA: Diagnosis present

## 2022-06-17 DIAGNOSIS — E8770 Fluid overload, unspecified: Secondary | ICD-10-CM | POA: Diagnosis present

## 2022-06-17 DIAGNOSIS — D696 Thrombocytopenia, unspecified: Secondary | ICD-10-CM | POA: Diagnosis not present

## 2022-06-17 DIAGNOSIS — Q231 Congenital insufficiency of aortic valve: Secondary | ICD-10-CM | POA: Diagnosis not present

## 2022-06-17 DIAGNOSIS — Z7982 Long term (current) use of aspirin: Secondary | ICD-10-CM

## 2022-06-17 DIAGNOSIS — E782 Mixed hyperlipidemia: Secondary | ICD-10-CM | POA: Diagnosis present

## 2022-06-17 DIAGNOSIS — Z833 Family history of diabetes mellitus: Secondary | ICD-10-CM | POA: Diagnosis not present

## 2022-06-17 DIAGNOSIS — I350 Nonrheumatic aortic (valve) stenosis: Secondary | ICD-10-CM | POA: Diagnosis not present

## 2022-06-17 HISTORY — PX: TEE WITHOUT CARDIOVERSION: SHX5443

## 2022-06-17 HISTORY — PX: AORTIC VALVE REPLACEMENT: SHX41

## 2022-06-17 LAB — POCT I-STAT 7, (LYTES, BLD GAS, ICA,H+H)
Acid-Base Excess: 2 mmol/L (ref 0.0–2.0)
Acid-Base Excess: 6 mmol/L — ABNORMAL HIGH (ref 0.0–2.0)
Acid-Base Excess: 3 mmol/L — ABNORMAL HIGH (ref 0.0–2.0)
Acid-Base Excess: 3 mmol/L — ABNORMAL HIGH (ref 0.0–2.0)
Bicarbonate: 29 mmol/L — ABNORMAL HIGH (ref 20.0–28.0)
Bicarbonate: 30.3 mmol/L — ABNORMAL HIGH (ref 20.0–28.0)
Bicarbonate: 27.9 mmol/L (ref 20.0–28.0)
Bicarbonate: 28.2 mmol/L — ABNORMAL HIGH (ref 20.0–28.0)
Calcium, Ion: 1.21 mmol/L (ref 1.15–1.40)
Calcium, Ion: 0.9 mmol/L — ABNORMAL LOW (ref 1.15–1.40)
Calcium, Ion: 0.97 mmol/L — ABNORMAL LOW (ref 1.15–1.40)
Calcium, Ion: 0.99 mmol/L — ABNORMAL LOW (ref 1.15–1.40)
HCT: 42 % (ref 39.0–52.0)
HCT: 29 % — ABNORMAL LOW (ref 39.0–52.0)
HCT: 27 % — ABNORMAL LOW (ref 39.0–52.0)
HCT: 31 % — ABNORMAL LOW (ref 39.0–52.0)
Hemoglobin: 14.3 g/dL (ref 13.0–17.0)
Hemoglobin: 9.9 g/dL — ABNORMAL LOW (ref 13.0–17.0)
Hemoglobin: 9.2 g/dL — ABNORMAL LOW (ref 13.0–17.0)
Hemoglobin: 10.5 g/dL — ABNORMAL LOW (ref 13.0–17.0)
O2 Saturation: 100 %
O2 Saturation: 100 %
O2 Saturation: 100 %
O2 Saturation: 100 %
Potassium: 4.3 mmol/L (ref 3.5–5.1)
Potassium: 4.5 mmol/L (ref 3.5–5.1)
Potassium: 5.9 mmol/L — ABNORMAL HIGH (ref 3.5–5.1)
Potassium: 5 mmol/L (ref 3.5–5.1)
Sodium: 139 mmol/L (ref 135–145)
Sodium: 139 mmol/L (ref 135–145)
Sodium: 136 mmol/L (ref 135–145)
Sodium: 139 mmol/L (ref 135–145)
TCO2: 31 mmol/L (ref 22–32)
TCO2: 32 mmol/L (ref 22–32)
TCO2: 29 mmol/L (ref 22–32)
TCO2: 30 mmol/L (ref 22–32)
pCO2 arterial: 54.2 mmHg — ABNORMAL HIGH (ref 32–48)
pCO2 arterial: 44 mmHg (ref 32–48)
pCO2 arterial: 40.9 mmHg (ref 32–48)
pCO2 arterial: 44.5 mmHg (ref 32–48)
pH, Arterial: 7.337 — ABNORMAL LOW (ref 7.35–7.45)
pH, Arterial: 7.446 (ref 7.35–7.45)
pH, Arterial: 7.441 (ref 7.35–7.45)
pH, Arterial: 7.41 (ref 7.35–7.45)
pO2, Arterial: 506 mmHg — ABNORMAL HIGH (ref 83–108)
pO2, Arterial: 431 mmHg — ABNORMAL HIGH (ref 83–108)
pO2, Arterial: 387 mmHg — ABNORMAL HIGH (ref 83–108)
pO2, Arterial: 467 mmHg — ABNORMAL HIGH (ref 83–108)

## 2022-06-17 LAB — CBC
HCT: 32.7 % — ABNORMAL LOW (ref 39.0–52.0)
HCT: 32.1 % — ABNORMAL LOW (ref 39.0–52.0)
Hemoglobin: 11.5 g/dL — ABNORMAL LOW (ref 13.0–17.0)
Hemoglobin: 11.3 g/dL — ABNORMAL LOW (ref 13.0–17.0)
MCH: 30.3 pg (ref 26.0–34.0)
MCH: 30.5 pg (ref 26.0–34.0)
MCHC: 35.2 g/dL (ref 30.0–36.0)
MCHC: 35.2 g/dL (ref 30.0–36.0)
MCV: 86.3 fL (ref 80.0–100.0)
MCV: 86.5 fL (ref 80.0–100.0)
Platelets: 91 10*3/uL — ABNORMAL LOW (ref 150–400)
Platelets: 94 10*3/uL — ABNORMAL LOW (ref 150–400)
RBC: 3.79 MIL/uL — ABNORMAL LOW (ref 4.22–5.81)
RBC: 3.71 MIL/uL — ABNORMAL LOW (ref 4.22–5.81)
RDW: 13 % (ref 11.5–15.5)
RDW: 13.1 % (ref 11.5–15.5)
WBC: 8.3 10*3/uL (ref 4.0–10.5)
WBC: 9.3 10*3/uL (ref 4.0–10.5)
nRBC: 0 % (ref 0.0–0.2)
nRBC: 0 % (ref 0.0–0.2)

## 2022-06-17 LAB — POCT I-STAT, CHEM 8
BUN: 22 mg/dL — ABNORMAL HIGH (ref 6–20)
BUN: 19 mg/dL (ref 6–20)
BUN: 20 mg/dL (ref 6–20)
BUN: 19 mg/dL (ref 6–20)
BUN: 18 mg/dL (ref 6–20)
BUN: 15 mg/dL (ref 6–20)
Calcium, Ion: 1.22 mmol/L (ref 1.15–1.40)
Calcium, Ion: 0.97 mmol/L — ABNORMAL LOW (ref 1.15–1.40)
Calcium, Ion: 1.16 mmol/L (ref 1.15–1.40)
Calcium, Ion: 0.95 mmol/L — ABNORMAL LOW (ref 1.15–1.40)
Calcium, Ion: 0.99 mmol/L — ABNORMAL LOW (ref 1.15–1.40)
Calcium, Ion: 1 mmol/L — ABNORMAL LOW (ref 1.15–1.40)
Chloride: 101 mmol/L (ref 98–111)
Chloride: 100 mmol/L (ref 98–111)
Chloride: 99 mmol/L (ref 98–111)
Chloride: 103 mmol/L (ref 98–111)
Chloride: 101 mmol/L (ref 98–111)
Chloride: 102 mmol/L (ref 98–111)
Creatinine, Ser: 1 mg/dL (ref 0.61–1.24)
Creatinine, Ser: 0.8 mg/dL (ref 0.61–1.24)
Creatinine, Ser: 1 mg/dL (ref 0.61–1.24)
Creatinine, Ser: 0.8 mg/dL (ref 0.61–1.24)
Creatinine, Ser: 0.8 mg/dL (ref 0.61–1.24)
Creatinine, Ser: 0.8 mg/dL (ref 0.61–1.24)
Glucose, Bld: 117 mg/dL — ABNORMAL HIGH (ref 70–99)
Glucose, Bld: 104 mg/dL — ABNORMAL HIGH (ref 70–99)
Glucose, Bld: 132 mg/dL — ABNORMAL HIGH (ref 70–99)
Glucose, Bld: 116 mg/dL — ABNORMAL HIGH (ref 70–99)
Glucose, Bld: 120 mg/dL — ABNORMAL HIGH (ref 70–99)
Glucose, Bld: 98 mg/dL (ref 70–99)
HCT: 40 % (ref 39.0–52.0)
HCT: 28 % — ABNORMAL LOW (ref 39.0–52.0)
HCT: 36 % — ABNORMAL LOW (ref 39.0–52.0)
HCT: 27 % — ABNORMAL LOW (ref 39.0–52.0)
HCT: 29 % — ABNORMAL LOW (ref 39.0–52.0)
HCT: 31 % — ABNORMAL LOW (ref 39.0–52.0)
Hemoglobin: 13.6 g/dL (ref 13.0–17.0)
Hemoglobin: 12.2 g/dL — ABNORMAL LOW (ref 13.0–17.0)
Hemoglobin: 9.5 g/dL — ABNORMAL LOW (ref 13.0–17.0)
Hemoglobin: 9.2 g/dL — ABNORMAL LOW (ref 13.0–17.0)
Hemoglobin: 9.9 g/dL — ABNORMAL LOW (ref 13.0–17.0)
Hemoglobin: 10.5 g/dL — ABNORMAL LOW (ref 13.0–17.0)
Potassium: 4.2 mmol/L (ref 3.5–5.1)
Potassium: 4.3 mmol/L (ref 3.5–5.1)
Potassium: 5.5 mmol/L — ABNORMAL HIGH (ref 3.5–5.1)
Potassium: 6.2 mmol/L — ABNORMAL HIGH (ref 3.5–5.1)
Potassium: 5 mmol/L (ref 3.5–5.1)
Potassium: 3.8 mmol/L (ref 3.5–5.1)
Sodium: 139 mmol/L (ref 135–145)
Sodium: 134 mmol/L — ABNORMAL LOW (ref 135–145)
Sodium: 141 mmol/L (ref 135–145)
Sodium: 136 mmol/L (ref 135–145)
Sodium: 139 mmol/L (ref 135–145)
Sodium: 144 mmol/L (ref 135–145)
TCO2: 30 mmol/L (ref 22–32)
TCO2: 23 mmol/L (ref 22–32)
TCO2: 27 mmol/L (ref 22–32)
TCO2: 27 mmol/L (ref 22–32)
TCO2: 27 mmol/L (ref 22–32)
TCO2: 30 mmol/L (ref 22–32)

## 2022-06-17 LAB — BASIC METABOLIC PANEL
Anion gap: 7 (ref 5–15)
BUN: 13 mg/dL (ref 6–20)
CO2: 26 mmol/L (ref 22–32)
Calcium: 7.5 mg/dL — ABNORMAL LOW (ref 8.9–10.3)
Chloride: 106 mmol/L (ref 98–111)
Creatinine, Ser: 1.03 mg/dL (ref 0.61–1.24)
GFR, Estimated: >60 mL/min (ref >60)
Glucose, Bld: 122 mg/dL — ABNORMAL HIGH (ref 70–99)
Potassium: 3.9 mmol/L (ref 3.5–5.1)
Sodium: 139 mmol/L (ref 135–145)

## 2022-06-17 LAB — GLUCOSE, CAPILLARY
Glucose-Capillary: 96 mg/dL (ref 70–99)
Glucose-Capillary: 103 mg/dL — ABNORMAL HIGH (ref 70–99)
Glucose-Capillary: 70 mg/dL (ref 70–99)
Glucose-Capillary: 128 mg/dL — ABNORMAL HIGH (ref 70–99)
Glucose-Capillary: 138 mg/dL — ABNORMAL HIGH (ref 70–99)
Glucose-Capillary: 116 mg/dL — ABNORMAL HIGH (ref 70–99)
Glucose-Capillary: 167 mg/dL — ABNORMAL HIGH (ref 70–99)
Glucose-Capillary: 183 mg/dL — ABNORMAL HIGH (ref 70–99)
Glucose-Capillary: 131 mg/dL — ABNORMAL HIGH (ref 70–99)

## 2022-06-17 LAB — ECHO INTRAOPERATIVE TEE
AR max vel: 0.57 cm2
AV Area VTI: 0.63 cm2
AV Area mean vel: 0.58 cm2
AV Mean grad: 48 mmHg
AV Peak grad: 90.6 mmHg
Ao pk vel: 4.76 m/s
Height: 67 in
Weight: 2800 oz

## 2022-06-17 LAB — PROTIME-INR
INR: 0.9 (ref 0.8–1.2)
Prothrombin Time: 12.4 seconds (ref 11.4–15.2)

## 2022-06-17 LAB — POCT I-STAT EG7
Acid-Base Excess: 4 mmol/L — ABNORMAL HIGH (ref 0.0–2.0)
Bicarbonate: 28.6 mmol/L — ABNORMAL HIGH (ref 20.0–28.0)
Calcium, Ion: 1.02 mmol/L — ABNORMAL LOW (ref 1.15–1.40)
HCT: 30 % — ABNORMAL LOW (ref 39.0–52.0)
Hemoglobin: 10.2 g/dL — ABNORMAL LOW (ref 13.0–17.0)
O2 Saturation: 80 %
Potassium: 4.2 mmol/L (ref 3.5–5.1)
Sodium: 141 mmol/L (ref 135–145)
TCO2: 30 mmol/L (ref 22–32)
pCO2, Ven: 43.8 mmHg — ABNORMAL LOW (ref 44–60)
pH, Ven: 7.423 (ref 7.25–7.43)
pO2, Ven: 44 mmHg (ref 32–45)

## 2022-06-17 LAB — APTT: aPTT: 30 seconds (ref 24–36)

## 2022-06-17 LAB — HEMOGLOBIN AND HEMATOCRIT, BLOOD
HCT: 29 % — ABNORMAL LOW (ref 39.0–52.0)
Hemoglobin: 10.3 g/dL — ABNORMAL LOW (ref 13.0–17.0)

## 2022-06-17 LAB — PLATELET COUNT: Platelets: 104 10*3/uL — ABNORMAL LOW (ref 150–400)

## 2022-06-17 LAB — MAGNESIUM: Magnesium: 3.1 mg/dL — ABNORMAL HIGH (ref 1.7–2.4)

## 2022-06-17 SURGERY — REPLACEMENT, AORTIC VALVE, OPEN
Anesthesia: General | Site: Esophagus

## 2022-06-17 MED ORDER — COAGULATION FACTOR VIIA RECOMB 1 MG IV SOLR
45.0000 ug/kg | Freq: Once | INTRAVENOUS | Status: AC
  Administered 2022-06-17: 4000 ug via INTRAVENOUS
  Filled 2022-06-17: qty 4

## 2022-06-17 MED ORDER — METOCLOPRAMIDE HCL 5 MG/ML IJ SOLN
10.0000 mg | Freq: Four times a day (QID) | INTRAMUSCULAR | Status: AC
  Administered 2022-06-17 – 2022-06-18 (×3): 10 mg via INTRAVENOUS
  Filled 2022-06-17 (×3): qty 2

## 2022-06-17 MED ORDER — ACETAMINOPHEN 500 MG PO TABS
1000.0000 mg | ORAL_TABLET | Freq: Four times a day (QID) | ORAL | Status: DC
  Administered 2022-06-18 – 2022-06-20 (×11): 1000 mg via ORAL
  Filled 2022-06-17 (×12): qty 2

## 2022-06-17 MED ORDER — FENTANYL CITRATE (PF) 250 MCG/5ML IJ SOLN
INTRAMUSCULAR | Status: AC
  Filled 2022-06-17: qty 5

## 2022-06-17 MED ORDER — LACTATED RINGERS IV SOLN: INTRAVENOUS | Status: DC | PRN

## 2022-06-17 MED ORDER — ~~LOC~~ CARDIAC SURGERY, PATIENT & FAMILY EDUCATION
Freq: Once | Status: DC
  Filled 2022-06-17: qty 1

## 2022-06-17 MED ORDER — NITROGLYCERIN 0.2 MG/ML ON CALL CATH LAB
INTRAVENOUS | Status: DC | PRN
  Administered 2022-06-17: 20 ug via INTRAVENOUS
  Administered 2022-06-17: 40 ug via INTRAVENOUS

## 2022-06-17 MED ORDER — ACETAMINOPHEN 160 MG/5ML PO SOLN: 650.0000 mg | Freq: Once | ORAL | Status: AC

## 2022-06-17 MED ORDER — FENTANYL CITRATE (PF) 250 MCG/5ML IJ SOLN
INTRAMUSCULAR | Status: DC | PRN
  Administered 2022-06-17: 100 ug via INTRAVENOUS
  Administered 2022-06-17: 50 ug via INTRAVENOUS
  Administered 2022-06-17 (×2): 100 ug via INTRAVENOUS
  Administered 2022-06-17: 50 ug via INTRAVENOUS
  Administered 2022-06-17: 150 ug via INTRAVENOUS
  Administered 2022-06-17 (×3): 100 ug via INTRAVENOUS
  Administered 2022-06-17: 50 ug via INTRAVENOUS
  Administered 2022-06-17: 200 ug via INTRAVENOUS
  Administered 2022-06-17: 150 ug via INTRAVENOUS
  Administered 2022-06-17: 50 ug via INTRAVENOUS
  Administered 2022-06-17 (×2): 100 ug via INTRAVENOUS
  Administered 2022-06-17: 50 ug via INTRAVENOUS
  Administered 2022-06-17: 100 ug via INTRAVENOUS

## 2022-06-17 MED ORDER — ACETAMINOPHEN 160 MG/5ML PO SOLN
1000.0000 mg | Freq: Four times a day (QID) | ORAL | Status: DC
  Filled 2022-06-17: qty 40.6

## 2022-06-17 MED ORDER — METOPROLOL TARTRATE 12.5 MG HALF TABLET: 12.5000 mg | ORAL_TABLET | Freq: Once | ORAL | Status: DC

## 2022-06-17 MED ORDER — ALBUMIN HUMAN 5 % IV SOLN: INTRAVENOUS | Status: DC | PRN

## 2022-06-17 MED ORDER — MAGNESIUM SULFATE 4 GM/100ML IV SOLN
4.0000 g | Freq: Once | INTRAVENOUS | Status: AC
  Administered 2022-06-17: 4 g via INTRAVENOUS
  Filled 2022-06-17: qty 100

## 2022-06-17 MED ORDER — LACTATED RINGERS IV SOLN: 500.0000 mL | Freq: Once | INTRAVENOUS | Status: DC | PRN

## 2022-06-17 MED ORDER — ACETAMINOPHEN 650 MG RE SUPP
650.0000 mg | Freq: Once | RECTAL | Status: AC
  Administered 2022-06-17: 650 mg via RECTAL

## 2022-06-17 MED ORDER — SODIUM CHLORIDE 0.9 % IV SOLN: INTRAVENOUS | Status: DC | PRN

## 2022-06-17 MED ORDER — HEPARIN SODIUM (PORCINE) 1000 UNIT/ML IJ SOLN
INTRAMUSCULAR | Status: AC
Start: 2022-06-17 — End: ?
  Filled 2022-06-17: qty 1

## 2022-06-17 MED ORDER — HEMOSTATIC AGENTS (NO CHARGE) OPTIME
TOPICAL | Status: DC | PRN
  Administered 2022-06-17: 1 via TOPICAL

## 2022-06-17 MED ORDER — ROCURONIUM BROMIDE 10 MG/ML (PF) SYRINGE
PREFILLED_SYRINGE | INTRAVENOUS | Status: AC
  Filled 2022-06-17: qty 20

## 2022-06-17 MED ORDER — ROCURONIUM BROMIDE 10 MG/ML (PF) SYRINGE
PREFILLED_SYRINGE | INTRAVENOUS | Status: AC
Start: 2022-06-17 — End: ?
  Filled 2022-06-17: qty 10

## 2022-06-17 MED ORDER — SODIUM CHLORIDE 0.9% FLUSH: 10.0000 mL | INTRAVENOUS | Status: DC | PRN

## 2022-06-17 MED ORDER — NITROGLYCERIN IN D5W 200-5 MCG/ML-% IV SOLN: 0.0000 ug/min | INTRAVENOUS | Status: DC

## 2022-06-17 MED ORDER — MIDAZOLAM HCL (PF) 5 MG/ML IJ SOLN
INTRAMUSCULAR | Status: DC | PRN
  Administered 2022-06-17 (×5): 2 mg via INTRAVENOUS

## 2022-06-17 MED ORDER — INSULIN REGULAR(HUMAN) IN NACL 100-0.9 UT/100ML-% IV SOLN: INTRAVENOUS | Status: DC

## 2022-06-17 MED ORDER — THROMBIN 20000 UNITS EX SOLR: OROMUCOSAL | Status: DC | PRN

## 2022-06-17 MED ORDER — PANTOPRAZOLE SODIUM 40 MG PO TBEC
80.0000 mg | DELAYED_RELEASE_TABLET | Freq: Every day | ORAL | Status: DC
  Administered 2022-06-18 – 2022-06-21 (×4): 80 mg via ORAL
  Filled 2022-06-17 (×4): qty 2

## 2022-06-17 MED ORDER — ONDANSETRON HCL 4 MG/2ML IJ SOLN
4.0000 mg | Freq: Four times a day (QID) | INTRAMUSCULAR | Status: DC | PRN
  Administered 2022-06-19: 4 mg via INTRAVENOUS
  Filled 2022-06-17: qty 2

## 2022-06-17 MED ORDER — PROTAMINE SULFATE 10 MG/ML IV SOLN
INTRAVENOUS | Status: DC | PRN
  Administered 2022-06-17: 280 mg via INTRAVENOUS

## 2022-06-17 MED ORDER — PANTOPRAZOLE SODIUM 40 MG PO TBEC: 40.0000 mg | DELAYED_RELEASE_TABLET | Freq: Every day | ORAL | Status: DC

## 2022-06-17 MED ORDER — VANCOMYCIN HCL IN DEXTROSE 1-5 GM/200ML-% IV SOLN
1000.0000 mg | Freq: Once | INTRAVENOUS | Status: AC
  Administered 2022-06-17: 1000 mg via INTRAVENOUS
  Filled 2022-06-17: qty 200

## 2022-06-17 MED ORDER — FLUTICASONE PROPIONATE 50 MCG/ACT NA SUSP
2.0000 | Freq: Every day | NASAL | Status: DC | PRN
Start: 2022-06-17 — End: 2022-06-21

## 2022-06-17 MED ORDER — PAROXETINE HCL 10 MG PO TABS
10.0000 mg | ORAL_TABLET | Freq: Every day | ORAL | Status: DC
  Administered 2022-06-18 – 2022-06-21 (×4): 10 mg via ORAL
  Filled 2022-06-17 (×4): qty 1

## 2022-06-17 MED ORDER — PHENYLEPHRINE HCL-NACL 20-0.9 MG/250ML-% IV SOLN: 0.0000 ug/min | INTRAVENOUS | Status: DC

## 2022-06-17 MED ORDER — PLASMA-LYTE A IV SOLN: INTRAVENOUS | Status: DC | PRN

## 2022-06-17 MED ORDER — SODIUM CHLORIDE 0.9 % IV SOLN
INTRAVENOUS | Status: DC
Start: 2022-06-17 — End: 2022-06-19

## 2022-06-17 MED ORDER — PROPOFOL 10 MG/ML IV BOLUS
INTRAVENOUS | Status: DC | PRN
  Administered 2022-06-17 (×2): 30 mg via INTRAVENOUS
  Administered 2022-06-17: 20 mg via INTRAVENOUS
  Administered 2022-06-17: 120 mg via INTRAVENOUS
  Administered 2022-06-17: 50 mg via INTRAVENOUS
  Administered 2022-06-17: 20 mg via INTRAVENOUS

## 2022-06-17 MED ORDER — DOCUSATE SODIUM 100 MG PO CAPS
200.0000 mg | ORAL_CAPSULE | Freq: Every day | ORAL | Status: DC
  Administered 2022-06-18 – 2022-06-19 (×2): 200 mg via ORAL
  Filled 2022-06-17 (×3): qty 2

## 2022-06-17 MED ORDER — SODIUM CHLORIDE 0.9% FLUSH
10.0000 mL | Freq: Two times a day (BID) | INTRAVENOUS | Status: DC
  Administered 2022-06-17 – 2022-06-19 (×4): 10 mL

## 2022-06-17 MED ORDER — ARTIFICIAL TEARS OPHTHALMIC OINT
TOPICAL_OINTMENT | OPHTHALMIC | Status: DC | PRN
  Administered 2022-06-17: 1 via OPHTHALMIC

## 2022-06-17 MED ORDER — NICARDIPINE HCL IN NACL 20-0.86 MG/200ML-% IV SOLN
3.0000 mg/h | INTRAVENOUS | Status: DC
  Administered 2022-06-17: 5 mg/h via INTRAVENOUS
  Administered 2022-06-17: 3 mg/h via INTRAVENOUS
  Administered 2022-06-18: 10 mg/h via INTRAVENOUS
  Administered 2022-06-18: 12.5 mg/h via INTRAVENOUS
  Administered 2022-06-18: 9 mg/h via INTRAVENOUS
  Filled 2022-06-17: qty 400
  Filled 2022-06-17 (×4): qty 200

## 2022-06-17 MED ORDER — DEXTROSE 50 % IV SOLN: 0.0000 mL | INTRAVENOUS | Status: DC | PRN

## 2022-06-17 MED ORDER — LACTATED RINGERS IV SOLN: INTRAVENOUS | Status: DC

## 2022-06-17 MED ORDER — BISACODYL 5 MG PO TBEC
10.0000 mg | DELAYED_RELEASE_TABLET | Freq: Every day | ORAL | Status: DC
  Administered 2022-06-18 – 2022-06-19 (×2): 10 mg via ORAL
  Filled 2022-06-17 (×3): qty 2

## 2022-06-17 MED ORDER — ACETAMINOPHEN 500 MG PO TABS
1000.0000 mg | ORAL_TABLET | Freq: Once | ORAL | Status: AC
  Administered 2022-06-17: 1000 mg via ORAL
  Filled 2022-06-17: qty 2

## 2022-06-17 MED ORDER — MORPHINE SULFATE (PF) 2 MG/ML IV SOLN
1.0000 mg | INTRAVENOUS | Status: DC | PRN
  Administered 2022-06-17: 4 mg via INTRAVENOUS
  Administered 2022-06-18 (×5): 2 mg via INTRAVENOUS
  Filled 2022-06-17: qty 2
  Filled 2022-06-17 (×5): qty 1

## 2022-06-17 MED ORDER — CHLORHEXIDINE GLUCONATE 0.12 % MT SOLN
15.0000 mL | Freq: Once | OROMUCOSAL | Status: AC
  Administered 2022-06-17 (×2): 15 mL via OROMUCOSAL
  Filled 2022-06-17: qty 15

## 2022-06-17 MED ORDER — OXYCODONE HCL 5 MG PO TABS
5.0000 mg | ORAL_TABLET | ORAL | Status: DC | PRN
  Filled 2022-06-17: qty 2

## 2022-06-17 MED ORDER — ASPIRIN 81 MG PO CHEW
324.0000 mg | CHEWABLE_TABLET | Freq: Every day | ORAL | Status: DC
  Filled 2022-06-17: qty 4

## 2022-06-17 MED ORDER — CHLORHEXIDINE GLUCONATE 4 % EX LIQD: 30.0000 mL | CUTANEOUS | Status: DC

## 2022-06-17 MED ORDER — MIDAZOLAM HCL (PF) 10 MG/2ML IJ SOLN
INTRAMUSCULAR | Status: AC
Start: 2022-06-17 — End: ?
  Filled 2022-06-17: qty 2

## 2022-06-17 MED ORDER — TRAMADOL HCL 50 MG PO TABS
50.0000 mg | ORAL_TABLET | ORAL | Status: DC | PRN
  Administered 2022-06-18 (×2): 50 mg via ORAL
  Filled 2022-06-17 (×2): qty 1

## 2022-06-17 MED ORDER — ROSUVASTATIN CALCIUM 5 MG PO TABS
10.0000 mg | ORAL_TABLET | Freq: Every day | ORAL | Status: DC
  Administered 2022-06-18 – 2022-06-21 (×4): 10 mg via ORAL
  Filled 2022-06-17 (×4): qty 2

## 2022-06-17 MED ORDER — FAMOTIDINE IN NACL 20-0.9 MG/50ML-% IV SOLN
20.0000 mg | Freq: Two times a day (BID) | INTRAVENOUS | Status: AC
  Administered 2022-06-17: 20 mg via INTRAVENOUS
  Filled 2022-06-17: qty 50

## 2022-06-17 MED ORDER — BISACODYL 10 MG RE SUPP: 10.0000 mg | Freq: Every day | RECTAL | Status: DC

## 2022-06-17 MED ORDER — CHLORHEXIDINE GLUCONATE CLOTH 2 % EX PADS
6.0000 | MEDICATED_PAD | Freq: Every day | CUTANEOUS | Status: DC
  Administered 2022-06-17 – 2022-06-19 (×3): 6 via TOPICAL

## 2022-06-17 MED ORDER — 0.9 % SODIUM CHLORIDE (POUR BTL) OPTIME
TOPICAL | Status: DC | PRN
  Administered 2022-06-17: 5000 mL

## 2022-06-17 MED ORDER — PHENYLEPHRINE 80 MCG/ML (10ML) SYRINGE FOR IV PUSH (FOR BLOOD PRESSURE SUPPORT)
PREFILLED_SYRINGE | INTRAVENOUS | Status: DC | PRN
  Administered 2022-06-17: 40 ug via INTRAVENOUS
  Administered 2022-06-17: 80 ug via INTRAVENOUS
  Administered 2022-06-17: 40 ug via INTRAVENOUS

## 2022-06-17 MED ORDER — HEPARIN SODIUM (PORCINE) 1000 UNIT/ML IJ SOLN
INTRAMUSCULAR | Status: DC | PRN
  Administered 2022-06-17: 28000 [IU] via INTRAVENOUS

## 2022-06-17 MED ORDER — METOPROLOL TARTRATE 25 MG/10 ML ORAL SUSPENSION: 12.5000 mg | Freq: Two times a day (BID) | ORAL | Status: DC

## 2022-06-17 MED ORDER — SODIUM CHLORIDE 0.9% FLUSH
3.0000 mL | Freq: Two times a day (BID) | INTRAVENOUS | Status: DC
  Administered 2022-06-18 – 2022-06-19 (×3): 3 mL via INTRAVENOUS

## 2022-06-17 MED ORDER — PROPOFOL 10 MG/ML IV BOLUS
INTRAVENOUS | Status: AC
  Filled 2022-06-17: qty 20

## 2022-06-17 MED ORDER — SODIUM CHLORIDE (PF) 0.9 % IJ SOLN
INTRAMUSCULAR | Status: AC
  Filled 2022-06-17: qty 10

## 2022-06-17 MED ORDER — ROCURONIUM BROMIDE 10 MG/ML (PF) SYRINGE
PREFILLED_SYRINGE | INTRAVENOUS | Status: DC | PRN
  Administered 2022-06-17: 50 mg via INTRAVENOUS
  Administered 2022-06-17: 100 mg via INTRAVENOUS
  Administered 2022-06-17 (×2): 50 mg via INTRAVENOUS

## 2022-06-17 MED ORDER — FUROSEMIDE 10 MG/ML IJ SOLN
INTRAMUSCULAR | Status: DC | PRN
  Administered 2022-06-17: 20 mg via INTRAMUSCULAR

## 2022-06-17 MED ORDER — THROMBIN (RECOMBINANT) 20000 UNITS EX SOLR
CUTANEOUS | Status: AC
  Filled 2022-06-17: qty 20000

## 2022-06-17 MED ORDER — SODIUM CHLORIDE 0.9% FLUSH: 3.0000 mL | INTRAVENOUS | Status: DC | PRN

## 2022-06-17 MED ORDER — PHENYLEPHRINE 80 MCG/ML (10ML) SYRINGE FOR IV PUSH (FOR BLOOD PRESSURE SUPPORT)
PREFILLED_SYRINGE | INTRAVENOUS | Status: AC
  Filled 2022-06-17: qty 10

## 2022-06-17 MED ORDER — MIDAZOLAM HCL 2 MG/2ML IJ SOLN
2.0000 mg | INTRAMUSCULAR | Status: DC | PRN
Start: 2022-06-17 — End: 2022-06-18

## 2022-06-17 MED ORDER — SODIUM CHLORIDE 0.45 % IV SOLN: INTRAVENOUS | Status: DC | PRN

## 2022-06-17 MED ORDER — METOPROLOL TARTRATE 5 MG/5ML IV SOLN: 2.5000 mg | INTRAVENOUS | Status: DC | PRN

## 2022-06-17 MED ORDER — POTASSIUM CHLORIDE 10 MEQ/50ML IV SOLN: 10.0000 meq | INTRAVENOUS | Status: AC

## 2022-06-17 MED ORDER — METOPROLOL TARTRATE 12.5 MG HALF TABLET: 12.5000 mg | ORAL_TABLET | Freq: Two times a day (BID) | ORAL | Status: DC

## 2022-06-17 MED ORDER — ASPIRIN 325 MG PO TBEC
325.0000 mg | DELAYED_RELEASE_TABLET | Freq: Every day | ORAL | Status: DC
Start: 2022-06-18 — End: 2022-06-19
  Administered 2022-06-18 – 2022-06-19 (×2): 325 mg via ORAL
  Filled 2022-06-17 (×2): qty 1

## 2022-06-17 MED ORDER — DEXMEDETOMIDINE HCL IN NACL 400 MCG/100ML IV SOLN
0.0000 ug/kg/h | INTRAVENOUS | Status: DC
  Administered 2022-06-17: 0.7 ug/kg/h via INTRAVENOUS
  Filled 2022-06-17: qty 100

## 2022-06-17 MED ORDER — CHLORHEXIDINE GLUCONATE 0.12 % MT SOLN
15.0000 mL | OROMUCOSAL | Status: AC
  Administered 2022-06-17: 15 mL via OROMUCOSAL
  Filled 2022-06-17: qty 15

## 2022-06-17 MED ORDER — CEFAZOLIN SODIUM-DEXTROSE 2-4 GM/100ML-% IV SOLN
2.0000 g | Freq: Three times a day (TID) | INTRAVENOUS | Status: AC
  Administered 2022-06-17 – 2022-06-19 (×6): 2 g via INTRAVENOUS
  Filled 2022-06-17 (×6): qty 100

## 2022-06-17 MED ORDER — ALBUMIN HUMAN 5 % IV SOLN
250.0000 mL | INTRAVENOUS | Status: DC | PRN
  Administered 2022-06-17 (×3): 12.5 g via INTRAVENOUS
  Filled 2022-06-17: qty 250

## 2022-06-17 MED ORDER — SODIUM CHLORIDE 0.9 % IV SOLN: 250.0000 mL | INTRAVENOUS | Status: DC

## 2022-06-17 SURGICAL SUPPLY — 72 items
ADAPTER CARDIO PERF ANTE/RETRO (ADAPTER) ×3 IMPLANT
BAG DECANTER FOR FLEXI CONT (MISCELLANEOUS) ×3 IMPLANT
BLADE CLIPPER SURG (BLADE) ×3 IMPLANT
BLADE STERNUM SYSTEM 6 (BLADE) ×3 IMPLANT
BLADE SURG 15 STRL LF DISP TIS (BLADE) ×2 IMPLANT
BLADE SURG 15 STRL SS (BLADE) ×1
CANISTER SUCT 3000ML PPV (MISCELLANEOUS) ×3 IMPLANT
CANNULA GUNDRY RCSP 15FR (MISCELLANEOUS) ×3 IMPLANT
CATH HEART VENT LEFT (CATHETERS) ×2 IMPLANT
CATH ROBINSON RED A/P 18FR (CATHETERS) ×9 IMPLANT
CATH THORACIC 36FR (CATHETERS) ×3 IMPLANT
CATH THORACIC 36FR RT ANG (CATHETERS) ×3 IMPLANT
CNTNR URN SCR LID CUP LEK RST (MISCELLANEOUS) ×2 IMPLANT
CONT SPEC 4OZ STRL OR WHT (MISCELLANEOUS) ×2
CONTAINER PROTECT SURGISLUSH (MISCELLANEOUS) ×6 IMPLANT
COVER SURGICAL LIGHT HANDLE (MISCELLANEOUS) ×3 IMPLANT
DEVICE SUT CK QUICK LOAD MINI (Prosthesis & Implant Heart) ×1 IMPLANT
DRAPE CARDIOVASCULAR INCISE (DRAPES) ×1
DRAPE SRG 135X102X78XABS (DRAPES) ×2 IMPLANT
DRAPE WARM FLUID 44X44 (DRAPES) ×3 IMPLANT
DRSG COVADERM 4X14 (GAUZE/BANDAGES/DRESSINGS) ×3 IMPLANT
ELECT CAUTERY BLADE 6.4 (BLADE) ×3 IMPLANT
ELECT REM PT RETURN 9FT ADLT (ELECTROSURGICAL) ×6
ELECTRODE REM PT RTRN 9FT ADLT (ELECTROSURGICAL) ×4 IMPLANT
FELT TEFLON 1X6 (MISCELLANEOUS) ×6 IMPLANT
GAUZE 4X4 16PLY ~~LOC~~+RFID DBL (SPONGE) ×3 IMPLANT
GAUZE SPONGE 4X4 12PLY STRL (GAUZE/BANDAGES/DRESSINGS) ×3 IMPLANT
GAUZE SPONGE 4X4 12PLY STRL LF (GAUZE/BANDAGES/DRESSINGS) ×1 IMPLANT
GLOVE BIO SURGEON STRL SZ 6 (GLOVE) IMPLANT
GLOVE BIO SURGEON STRL SZ 6.5 (GLOVE) IMPLANT
GLOVE BIO SURGEON STRL SZ7 (GLOVE) IMPLANT
GLOVE BIO SURGEON STRL SZ7.5 (GLOVE) IMPLANT
GLOVE SURG MICRO LTX SZ7 (GLOVE) ×6 IMPLANT
GOWN STRL REUS W/ TWL LRG LVL3 (GOWN DISPOSABLE) ×8 IMPLANT
GOWN STRL REUS W/ TWL XL LVL3 (GOWN DISPOSABLE) ×2 IMPLANT
GOWN STRL REUS W/TWL LRG LVL3 (GOWN DISPOSABLE) ×7
GOWN STRL REUS W/TWL XL LVL3 (GOWN DISPOSABLE) ×1
HEMOSTAT POWDER SURGIFOAM 1G (HEMOSTASIS) ×9 IMPLANT
HEMOSTAT SURGICEL 2X14 (HEMOSTASIS) ×3 IMPLANT
KIT BASIN OR (CUSTOM PROCEDURE TRAY) ×3 IMPLANT
KIT CATH CPB BARTLE (MISCELLANEOUS) ×3 IMPLANT
KIT SUCTION CATH 14FR (SUCTIONS) ×3 IMPLANT
KIT SUT CK MINI COMBO 4X17 (Prosthesis & Implant Heart) ×1 IMPLANT
KIT TURNOVER KIT B (KITS) ×3 IMPLANT
LINE VENT (MISCELLANEOUS) ×1 IMPLANT
NS IRRIG 1000ML POUR BTL (IV SOLUTION) ×18 IMPLANT
PACK E OPEN HEART (SUTURE) ×3 IMPLANT
PACK OPEN HEART (CUSTOM PROCEDURE TRAY) ×3 IMPLANT
PAD ARMBOARD 7.5X6 YLW CONV (MISCELLANEOUS) ×6 IMPLANT
POSITIONER HEAD DONUT 9IN (MISCELLANEOUS) ×3 IMPLANT
SET MPS 3-ND DEL (MISCELLANEOUS) ×1 IMPLANT
SPONGE T-LAP 18X18 ~~LOC~~+RFID (SPONGE) ×8 IMPLANT
SPONGE T-LAP 4X18 ~~LOC~~+RFID (SPONGE) ×3 IMPLANT
SUT BONE WAX W31G (SUTURE) ×3 IMPLANT
SUT EB EXC GRN/WHT 2-0 V-5 (SUTURE) ×6 IMPLANT
SUT ETHIBON EXCEL 2-0 V-5 (SUTURE) ×1 IMPLANT
SUT PROLENE 3 0 SH DA (SUTURE) IMPLANT
SUT PROLENE 3 0 SH1 36 (SUTURE) ×5 IMPLANT
SUT PROLENE 4 0 RB 1 (SUTURE) ×5
SUT PROLENE 4-0 RB1 .5 CRCL 36 (SUTURE) ×6 IMPLANT
SUT STEEL 6MS V (SUTURE) ×2 IMPLANT
SUT VIC AB 1 CTX 36 (SUTURE) ×2
SUT VIC AB 1 CTX36XBRD ANBCTR (SUTURE) ×4 IMPLANT
SYSTEM SAHARA CHEST DRAIN ATS (WOUND CARE) ×3 IMPLANT
TAPE CLOTH SURG 4X10 WHT LF (GAUZE/BANDAGES/DRESSINGS) ×1 IMPLANT
TOWEL GREEN STERILE (TOWEL DISPOSABLE) ×3 IMPLANT
TOWEL GREEN STERILE FF (TOWEL DISPOSABLE) ×3 IMPLANT
TRAY FOLEY SLVR 16FR TEMP STAT (SET/KITS/TRAYS/PACK) ×3 IMPLANT
UNDERPAD 30X36 HEAVY ABSORB (UNDERPADS AND DIAPERS) ×3 IMPLANT
VALVE AORTIC SZ23 INSP/RESIL (Prosthesis & Implant Heart) ×1 IMPLANT
VENT LEFT HEART 12002 (CATHETERS) ×3
WATER STERILE IRR 1000ML POUR (IV SOLUTION) ×6 IMPLANT

## 2022-06-17 NOTE — Brief Op Note (Signed)
06/17/2022  7:30 AM  PATIENT:  Darren Lyons  56 y.o. male  PRE-OPERATIVE DIAGNOSIS:  SEVERE AORTIC STENOSIS  POST-OPERATIVE DIAGNOSIS:  SEVERE AORTIC STENOSIS  PROCEDURE:  Procedure(s):  AORTIC VALVE REPLACEMENT (AVR)  -23 mm Edwards Inspiris Resilia Bioprosthetic Valve  TRANSESOPHAGEAL ECHOCARDIOGRAM (TEE) (N/A)  SURGEON:  Surgeon(s) and Role:    * Bartle, Payton Doughty, MD - Primary  PHYSICIAN ASSISTANT: Lowella Dandy PA-C  ASSISTANTS: Tanda Rockers RNFA   ANESTHESIA:   general  EBL:  974 mL   BLOOD ADMINISTERED:   CC CELLSAVER  DRAINS:  Mediastinal Chest Drains    LOCAL MEDICATIONS USED:  NONE  SPECIMEN:  Source of Specimen:  Aortic Valve Leaflets  DISPOSITION OF SPECIMEN:  PATHOLOGY  COUNTS:  YES  TOURNIQUET:  * No tourniquets in log *  DICTATION: .Dragon Dictation  PLAN OF CARE: Admit to inpatient   PATIENT DISPOSITION:  ICU - intubated and hemodynamically stable.   Delay start of Pharmacological VTE agent (>24hrs) due to surgical blood loss or risk of bleeding: yes

## 2022-06-17 NOTE — Progress Notes (Signed)
Pt placed on PS/CPAP per rapid wean protocol. Pt tolerating well currently. RT will cont to monitor as needed.

## 2022-06-17 NOTE — Hospital Course (Addendum)
History of Present Illness:  Darren Lyons is a 56 year old gentleman with history of hypertension, hyperlipidemia, previous smoking, and bicuspid aortic valve disease that has been followed by cardiology with echocardiogram.  He was evaluated in October 2021 by Dr. Geraldo Pitter for episodes of chest discomfort.  He was referred to Dr. Claiborne Billings and underwent cardiac catheterization suggesting aortic stenosis.  The mean gradient was measured at 42 mmHg with a peak to peak gradient of 43 mmHg.  He had mild nonobstructive coronary disease with a 20% stenosis in the LAD and left circumflex marginal and RCA.  His echo in October 2021 showed moderate calcification and thickening of the aortic valve with a mean gradient of 30 mmHg and a peak gradient of 50 mmHg.  Aortic valve area by VTI was 0.63 cm.  The ascending aorta was measured at 3.8 cm.  Left ventricular ejection fraction was 60 to 65% with mild concentric LVH.  A follow-up echocardiogram in October 2022 showed bicuspid aortic valve with a mean gradient that had increased to 38.6 mmHg.  The ascending aorta was measured at 3.9 cm.  There was normal left ventricular systolic function with mild concentric LVH and grade 2 diastolic dysfunction.  He had a myocardial amyloid scan done in November 2022 which was equivocal with no definite evidence of amyloid.  He has continued to remain active and plays disc golf but has noticed exertional shortness of breath and fatigue with walking up hills.  He has some left-sided chest discomfort.  He denies any dizziness or syncope.  He has had no peripheral edema or orthopnea.  A follow-up echo on 04/09/2022 showed a mean gradient across aortic valve of 42 mmHg with peak gradient of 76 mmHg.  Left ventricular ejection fraction 65 to 70% with moderate LVH.  The ascending aorta is measured at 40 mm.  He was evaluated by Dr. Cyndia Bent who felt the patient would benefit from Aortic Valve Replacement.  The risks and benefits of the procedure were  explained to the patient and he was agreeable to proceed.  Hospital Course:  Nasim Habeeb presented to [facility] on 06/17/2022.  The patient was taken to the operating room and underwent Aortic Valve Replacement with a 23 mm Edwards Inspiris Resilia Bioprosthetic Valve.  He tolerated the procedure without difficulty and was taken to the SICU in stable condition.  The patient was extubated the evening of surgery.  He was hypertensive requiring Cardene drip.  His Lopressor dose was increased to help facilitate weaning of drip.  His chest tubes and arterial lines were removed without difficulty.  He was mildly volume overloaded and lasix was started.  He was maintaining NSR and was stable for transfer to the progressive care unit on 06/19/2022.  The patient was hypertensive and started on low dose Cozaar.  His pacing wires were removed without difficulty.

## 2022-06-17 NOTE — Progress Notes (Signed)
PS/CPAP 10/5 40% started. Pt tolerating well, following commands, VSS. RT will cont to monitor.

## 2022-06-17 NOTE — Anesthesia Procedure Notes (Signed)
Procedure Name: Intubation Date/Time: 06/17/2022 7:42 AM  Performed by: Maxine Glenn, CRNAPre-anesthesia Checklist: Patient identified, Emergency Drugs available, Suction available and Patient being monitored Patient Re-evaluated:Patient Re-evaluated prior to induction Oxygen Delivery Method: Circle System Utilized Preoxygenation: Pre-oxygenation with 100% oxygen Induction Type: IV induction Ventilation: Mask ventilation without difficulty Laryngoscope Size: Mac and 4 Grade View: Grade II Tube type: Oral Tube size: 8.0 mm Number of attempts: 1 Airway Equipment and Method: Stylet Placement Confirmation: ETT inserted through vocal cords under direct vision, positive ETCO2 and breath sounds checked- equal and bilateral Secured at: 23 cm Tube secured with: Tape Dental Injury: Teeth and Oropharynx as per pre-operative assessment

## 2022-06-18 ENCOUNTER — Encounter (HOSPITAL_COMMUNITY): Payer: Self-pay | Admitting: Surgery

## 2022-06-18 ENCOUNTER — Inpatient Hospital Stay (HOSPITAL_COMMUNITY): Payer: 59

## 2022-06-18 ENCOUNTER — Other Ambulatory Visit: Payer: Self-pay | Admitting: Cardiology

## 2022-06-18 DIAGNOSIS — Z952 Presence of prosthetic heart valve: Secondary | ICD-10-CM

## 2022-06-18 LAB — GLUCOSE, CAPILLARY
Glucose-Capillary: 104 mg/dL — ABNORMAL HIGH (ref 70–99)
Glucose-Capillary: 119 mg/dL — ABNORMAL HIGH (ref 70–99)
Glucose-Capillary: 127 mg/dL — ABNORMAL HIGH (ref 70–99)
Glucose-Capillary: 128 mg/dL — ABNORMAL HIGH (ref 70–99)
Glucose-Capillary: 131 mg/dL — ABNORMAL HIGH (ref 70–99)
Glucose-Capillary: 132 mg/dL — ABNORMAL HIGH (ref 70–99)
Glucose-Capillary: 135 mg/dL — ABNORMAL HIGH (ref 70–99)
Glucose-Capillary: 137 mg/dL — ABNORMAL HIGH (ref 70–99)
Glucose-Capillary: 155 mg/dL — ABNORMAL HIGH (ref 70–99)
Glucose-Capillary: 158 mg/dL — ABNORMAL HIGH (ref 70–99)
Glucose-Capillary: 163 mg/dL — ABNORMAL HIGH (ref 70–99)
Glucose-Capillary: 66 mg/dL — ABNORMAL LOW (ref 70–99)
Glucose-Capillary: 70 mg/dL (ref 70–99)
Glucose-Capillary: 91 mg/dL (ref 70–99)
Glucose-Capillary: 98 mg/dL (ref 70–99)

## 2022-06-18 LAB — BASIC METABOLIC PANEL
Anion gap: 11 (ref 5–15)
Anion gap: 13 (ref 5–15)
BUN: 16 mg/dL (ref 6–20)
BUN: 21 mg/dL — ABNORMAL HIGH (ref 6–20)
CO2: 24 mmol/L (ref 22–32)
CO2: 22 mmol/L (ref 22–32)
Calcium: 7.9 mg/dL — ABNORMAL LOW (ref 8.9–10.3)
Calcium: 8 mg/dL — ABNORMAL LOW (ref 8.9–10.3)
Chloride: 105 mmol/L (ref 98–111)
Chloride: 104 mmol/L (ref 98–111)
Creatinine, Ser: 1.1 mg/dL (ref 0.61–1.24)
Creatinine, Ser: 1.17 mg/dL (ref 0.61–1.24)
GFR, Estimated: >60 mL/min (ref >60)
GFR, Estimated: >60 mL/min (ref >60)
Glucose, Bld: 157 mg/dL — ABNORMAL HIGH (ref 70–99)
Glucose, Bld: 168 mg/dL — ABNORMAL HIGH (ref 70–99)
Potassium: 4.1 mmol/L (ref 3.5–5.1)
Potassium: 3.6 mmol/L (ref 3.5–5.1)
Sodium: 140 mmol/L (ref 135–145)
Sodium: 139 mmol/L (ref 135–145)

## 2022-06-18 LAB — POCT I-STAT 7, (LYTES, BLD GAS, ICA,H+H)
Acid-Base Excess: 4 mmol/L — ABNORMAL HIGH (ref 0.0–2.0)
Acid-base deficit: 1 mmol/L (ref 0.0–2.0)
Acid-base deficit: 2 mmol/L (ref 0.0–2.0)
Bicarbonate: 23.6 mmol/L (ref 20.0–28.0)
Bicarbonate: 24.6 mmol/L (ref 20.0–28.0)
Bicarbonate: 29.5 mmol/L — ABNORMAL HIGH (ref 20.0–28.0)
Calcium, Ion: 1.01 mmol/L — ABNORMAL LOW (ref 1.15–1.40)
Calcium, Ion: 1.06 mmol/L — ABNORMAL LOW (ref 1.15–1.40)
Calcium, Ion: 1.09 mmol/L — ABNORMAL LOW (ref 1.15–1.40)
HCT: 32 % — ABNORMAL LOW (ref 39.0–52.0)
HCT: 33 % — ABNORMAL LOW (ref 39.0–52.0)
HCT: 33 % — ABNORMAL LOW (ref 39.0–52.0)
Hemoglobin: 10.9 g/dL — ABNORMAL LOW (ref 13.0–17.0)
Hemoglobin: 11.2 g/dL — ABNORMAL LOW (ref 13.0–17.0)
Hemoglobin: 11.2 g/dL — ABNORMAL LOW (ref 13.0–17.0)
O2 Saturation: 96 %
O2 Saturation: 97 %
O2 Saturation: 99 %
Patient temperature: 35.8
Patient temperature: 35.9
Patient temperature: 36.2
Potassium: 3.5 mmol/L (ref 3.5–5.1)
Potassium: 3.7 mmol/L (ref 3.5–5.1)
Potassium: 4 mmol/L (ref 3.5–5.1)
Sodium: 141 mmol/L (ref 135–145)
Sodium: 142 mmol/L (ref 135–145)
Sodium: 144 mmol/L (ref 135–145)
TCO2: 25 mmol/L (ref 22–32)
TCO2: 26 mmol/L (ref 22–32)
TCO2: 31 mmol/L (ref 22–32)
pCO2 arterial: 40.4 mmHg (ref 32–48)
pCO2 arterial: 43.6 mmHg (ref 32–48)
pCO2 arterial: 47.9 mmHg (ref 32–48)
pH, Arterial: 7.354 (ref 7.35–7.45)
pH, Arterial: 7.369 (ref 7.35–7.45)
pH, Arterial: 7.394 (ref 7.35–7.45)
pO2, Arterial: 115 mmHg — ABNORMAL HIGH (ref 83–108)
pO2, Arterial: 78 mmHg — ABNORMAL LOW (ref 83–108)
pO2, Arterial: 87 mmHg (ref 83–108)

## 2022-06-18 LAB — CBC
HCT: 33.2 % — ABNORMAL LOW (ref 39.0–52.0)
HCT: 31.8 % — ABNORMAL LOW (ref 39.0–52.0)
Hemoglobin: 11.5 g/dL — ABNORMAL LOW (ref 13.0–17.0)
Hemoglobin: 10.6 g/dL — ABNORMAL LOW (ref 13.0–17.0)
MCH: 30 pg (ref 26.0–34.0)
MCH: 29.3 pg (ref 26.0–34.0)
MCHC: 34.6 g/dL (ref 30.0–36.0)
MCHC: 33.3 g/dL (ref 30.0–36.0)
MCV: 86.7 fL (ref 80.0–100.0)
MCV: 87.8 fL (ref 80.0–100.0)
Platelets: 109 10*3/uL — ABNORMAL LOW (ref 150–400)
Platelets: 111 10*3/uL — ABNORMAL LOW (ref 150–400)
RBC: 3.83 MIL/uL — ABNORMAL LOW (ref 4.22–5.81)
RBC: 3.62 MIL/uL — ABNORMAL LOW (ref 4.22–5.81)
RDW: 13.3 % (ref 11.5–15.5)
RDW: 13.5 % (ref 11.5–15.5)
WBC: 13.4 10*3/uL — ABNORMAL HIGH (ref 4.0–10.5)
WBC: 15.3 10*3/uL — ABNORMAL HIGH (ref 4.0–10.5)
nRBC: 0 % (ref 0.0–0.2)
nRBC: 0 % (ref 0.0–0.2)

## 2022-06-18 LAB — MAGNESIUM
Magnesium: 2.5 mg/dL — ABNORMAL HIGH (ref 1.7–2.4)
Magnesium: 2.4 mg/dL (ref 1.7–2.4)

## 2022-06-18 LAB — SURGICAL PATHOLOGY

## 2022-06-18 MED ORDER — FUROSEMIDE 10 MG/ML IJ SOLN
40.0000 mg | Freq: Two times a day (BID) | INTRAMUSCULAR | Status: AC
  Administered 2022-06-18 (×2): 40 mg via INTRAVENOUS
  Filled 2022-06-18 (×2): qty 4

## 2022-06-18 MED ORDER — INSULIN ASPART 100 UNIT/ML IJ SOLN
0.0000 [IU] | INTRAMUSCULAR | Status: DC
  Administered 2022-06-18: 4 [IU] via SUBCUTANEOUS
  Administered 2022-06-18 – 2022-06-19 (×2): 2 [IU] via SUBCUTANEOUS

## 2022-06-18 MED ORDER — METOPROLOL TARTRATE 25 MG/10 ML ORAL SUSPENSION
25.0000 mg | Freq: Two times a day (BID) | ORAL | Status: DC
  Filled 2022-06-18: qty 10

## 2022-06-18 MED ORDER — METOPROLOL TARTRATE 25 MG PO TABS
25.0000 mg | ORAL_TABLET | Freq: Two times a day (BID) | ORAL | Status: DC
  Administered 2022-06-18 – 2022-06-19 (×3): 25 mg via ORAL
  Filled 2022-06-18 (×3): qty 1

## 2022-06-18 MED ORDER — NICARDIPINE HCL IN NACL 40-0.83 MG/200ML-% IV SOLN
3.0000 mg/h | INTRAVENOUS | Status: DC
  Administered 2022-06-18: 15 mg/h via INTRAVENOUS
  Administered 2022-06-18: 12.5 mg/h via INTRAVENOUS
  Filled 2022-06-18 (×3): qty 200

## 2022-06-18 MED ORDER — INSULIN DETEMIR 100 UNIT/ML ~~LOC~~ SOLN
15.0000 [IU] | Freq: Once | SUBCUTANEOUS | Status: AC
  Administered 2022-06-18: 15 [IU] via SUBCUTANEOUS
  Filled 2022-06-18: qty 0.15

## 2022-06-18 MED ORDER — ORAL CARE MOUTH RINSE: 15.0000 mL | OROMUCOSAL | Status: DC | PRN

## 2022-06-18 MED ORDER — POTASSIUM CHLORIDE CRYS ER 20 MEQ PO TBCR
20.0000 meq | EXTENDED_RELEASE_TABLET | Freq: Two times a day (BID) | ORAL | Status: AC
Start: 2022-06-18 — End: 2022-06-18
  Administered 2022-06-18 (×2): 20 meq via ORAL
  Filled 2022-06-18 (×2): qty 1

## 2022-06-18 MED FILL — Sodium Chloride IV Soln 0.9%: INTRAVENOUS | Qty: 3000 | Status: AC

## 2022-06-18 MED FILL — Electrolyte-R (PH 7.4) Solution: INTRAVENOUS | Qty: 5000 | Status: AC

## 2022-06-18 MED FILL — Thrombin (Recombinant) For Soln 20000 Unit: CUTANEOUS | Qty: 1 | Status: AC

## 2022-06-18 MED FILL — Albumin, Human Inj 5%: INTRAVENOUS | Qty: 250 | Status: AC

## 2022-06-18 MED FILL — Sodium Bicarbonate IV Soln 8.4%: INTRAVENOUS | Qty: 50 | Status: AC

## 2022-06-18 MED FILL — Heparin Sodium (Porcine) Inj 1000 Unit/ML: INTRAMUSCULAR | Qty: 10 | Status: AC

## 2022-06-18 MED FILL — Mannitol IV Soln 20%: INTRAVENOUS | Qty: 500 | Status: AC

## 2022-06-18 NOTE — Discharge Summary (Signed)
[address]       [place] [address]             [phone]    Physician Discharge Summary  Patient ID: Darren Lyons MRN: [id number] DOB/AGE: Feb 14, 1966 56 y.o.  Admit date: 06/17/2022 Discharge date: 06/21/2022  Admission Diagnoses:  Patient Active Problem List   Diagnosis Date Noted   Bicuspid aortic valve    Aortic valve stenosis    Angina pectoris ([place]) 09/29/2020   Cigarette smoker 09/29/2020   Skin cancer of arm, right    Migraine    Hypertension    Hyperlipidemia    Essential hypertension 09/19/2020   Mixed hyperlipidemia 09/19/2020   Other fatigue 09/19/2020   Other chest pain 09/19/2020   Discharge Diagnoses:  Patient Active Problem List   Diagnosis Date Noted   S/P AVR 06/17/2022   Bicuspid aortic valve    Aortic valve stenosis    Angina pectoris ([place]) 09/29/2020   Cigarette smoker 09/29/2020   Skin cancer of arm, right    Migraine    Hypertension    Hyperlipidemia    Essential hypertension 09/19/2020   Mixed hyperlipidemia 09/19/2020   Other fatigue 09/19/2020   Other chest pain 09/19/2020   Discharged Condition: good  History of Present Illness:  Darren Lyons is a 56 year old gentleman with history of hypertension, hyperlipidemia, previous smoking, and bicuspid aortic valve disease that has been followed by cardiology with echocardiogram.  He was evaluated in October 2021 by Dr. Geraldo Pitter for episodes of chest discomfort.  He was referred to Dr. Claiborne Billings and underwent cardiac catheterization suggesting aortic stenosis.  The mean gradient was measured at 42 mmHg with a peak to peak gradient of 43 mmHg.  He had mild nonobstructive coronary disease with a 20% stenosis in the LAD and left circumflex marginal and RCA.  His echo in October 2021 showed moderate calcification and thickening of the aortic valve with a mean gradient of 30 mmHg and a peak gradient of 50 mmHg.  Aortic valve area by VTI was 0.63 cm.  The ascending aorta was  measured at 3.8 cm.  Left ventricular ejection fraction was 60 to 65% with mild concentric LVH.  A follow-up echocardiogram in October 2022 showed bicuspid aortic valve with a mean gradient that had increased to 38.6 mmHg.  The ascending aorta was measured at 3.9 cm.  There was normal left ventricular systolic function with mild concentric LVH and grade 2 diastolic dysfunction.  He had a myocardial amyloid scan done in November 2022 which was equivocal with no definite evidence of amyloid.  He has continued to remain active and plays disc golf but has noticed exertional shortness of breath and fatigue with walking up hills.  He has some left-sided chest discomfort.  He denies any dizziness or syncope.  He has had no peripheral edema or orthopnea.  A follow-up echo on 04/09/2022 showed a mean gradient across aortic valve of 42 mmHg with peak gradient of 76 mmHg.  Left ventricular ejection fraction 65 to 70% with moderate LVH.  The ascending aorta is measured at 40 mm.  He was evaluated by Dr. Cyndia Bent who felt the patient would benefit from Aortic Valve Replacement.  The risks and benefits of the procedure were explained to the patient and he was agreeable to proceed.  Hospital Course:  Mitch Arquette presented to [facility] on 06/17/2022.  The patient was taken to the operating room and underwent Aortic Valve Replacement with a  23 mm Edwards Inspiris Resilia Bioprosthetic Valve.  He tolerated the procedure without difficulty and was taken to the SICU in stable condition.  The patient was extubated the evening of surgery.  He was hypertensive requiring Cardene drip.  His Lopressor dose was increased to help facilitate weaning of drip.  His chest tubes and arterial lines were removed without difficulty.  He was mildly volume overloaded and lasix was started.  He was maintaining NSR and was stable for transfer to the progressive care unit on 06/19/2022.  The patient was hypertensive and started on low dose  Cozaar.  His pacing wires were removed without difficulty.    Consults: None  Significant Diagnostic Studies: cardiac graphics:   Echocardiogram:   IMPRESSIONS     1. Left ventricular ejection fraction, by estimation, is 65 to 70%. The  left ventricle has normal function. The left ventricle has no regional  wall motion abnormalities. There is moderate left ventricular hypertrophy.  Left ventricular diastolic  parameters are indeterminate.   2. Right ventricular systolic function is normal. The right ventricular  size is normal.   3. Right atrial size was mildly dilated.   4. The mitral valve is degenerative. No evidence of mitral valve  regurgitation. No evidence of mitral stenosis. Moderate mitral annular  calcification.   5. Aortic dilatation noted. There is dilatation of the ascending aorta,  measuring 40 mm.   6. The inferior vena cava is normal in size with greater than 50%  respiratory variability, suggesting right atrial pressure of 3 mmHg.   7. The aortic valve is calcified. There is severe calcifcation of the  aortic valve. Aortic valve regurgitation is not visualized. Severe aortic  valve stenosis. Vmax 4.4 m/s, MG 42 mmHg, AVA 0.8 cm^2, DI 0.21   Comparison(s): EF 60%, mild LVH, mod MAC, mod AS mean 38.6 mmHg, peak 65.8  mmHg, asc aor 38 mm, GLS -14.3%.    Treatments: surgery:   06/17/2022 Mickle Asper [id number]   Surgeon:  Gaye Pollack, MD   First Assistant: Ellwood Handler,  PA-C:  An experienced assistant was required given the complexity of this surgery and the standard of surgical care. The assistant was needed for exposure, dissection, suctioning, retraction of delicate tissues and sutures, instrument exchange and for overall help during this procedure.      Preoperative Diagnosis:  Severe bicuspid aortic valve stenosis     Postoperative Diagnosis:  Same     Procedure:   Median Sternotomy Extracorporeal circulation 3.   Aortic valve replacement  using a 23 mm Edwards INSPIRIS RESILIA pericardial valve.   Discharge Exam: Blood pressure 130/86, pulse 84, temperature 98.1 F (36.7 C), temperature source Oral, resp. rate 16, height _0  (1.702 m), weight 77.7 kg, SpO2 98 %.  General appearance: alert, cooperative, and no distress Heart: regular rate and rhythm Lungs: clear to auscultation bilaterally Abdomen: soft, non-tender; bowel sounds normal; no masses,  no organomegaly Extremities: extremities normal, atraumatic, no cyanosis or edema Wound: clean and dry   Discharge Medications:  The patient has been discharged on:   1.Beta Blocker:  Yes [ x  ]                              No   [   ]  If No, reason:  2.Ace Inhibitor/ARB: Yes [ x  ]                                     No  [    ]                                     If No, reason:  3.Statin:   Yes [  x ]                  No  [   ]                  If No, reason:  4.Ecasa:  Yes  [ x  ]                  No   [   ]                  If No, reason:  Patient had ACS upon admission: No   Plavix/P2Y12 inhibitor: Yes [   ]                                      No  [ x  ]   Allergies as of 06/21/2022       Reactions   Codeine Rash        Medication List     STOP taking these medications    cetirizine-pseudoephedrine 5-120 MG tablet Commonly known as: ZYRTEC-D   ibuprofen 800 MG tablet Commonly known as: ADVIL       TAKE these medications    acetaminophen 500 MG tablet Commonly known as: TYLENOL Take 1-2 tablets (500-1,000 mg total) by mouth every 6 (six) hours.   aspirin EC 325 MG tablet Take 1 tablet (325 mg total) by mouth daily. What changed:  medication strength how much to take additional instructions   atenolol 50 MG tablet Commonly known as: TENORMIN Take 1 tablet (50 mg total) by mouth daily.   fluticasone 50 MCG/ACT nasal spray Commonly known as: FLONASE PLACE 2 SPRAYS INTO BOTH NOSTRILS DAILY AS  NEEDED FOR ALLERGIES OR RHINITIS   furosemide 40 MG tablet Commonly known as: LASIX Take 1 tablet (40 mg total) by mouth daily as needed. For weight gain of 3 lbs in 24 hours or 5 lbs in 1 week   losartan 25 MG tablet Commonly known as: COZAAR Take 1 tablet (25 mg total) by mouth daily.   nitroGLYCERIN 0.4 MG SL tablet Commonly known as: NITROSTAT Place 0.4 mg under the tongue every 5 (five) minutes as needed for chest pain.   omeprazole 40 MG capsule Commonly known as: PRILOSEC Take 1 capsule by mouth once daily   PARoxetine 10 MG tablet Commonly known as: PAXIL Take 1 tablet (10 mg total) by mouth daily.   potassium chloride SA 20 MEQ tablet Commonly known as: KLOR-CON M Take 1 tablet (20 mEq total) by mouth daily as needed. Only take on days you take a Lasix   rosuvastatin 10 MG tablet Commonly known as: CRESTOR Take 1 tablet by mouth once daily   traMADol 50 MG tablet Commonly known as: ULTRAM Take 1-2 tablets (50-100 mg total) by mouth every  4 (four) hours as needed for moderate pain.               Durable Medical Equipment  (From admission, onward)           Start     Ordered   06/20/22 0742  For home use only DME Walker rolling  Once       Question Answer Comment  Walker: With 5 Inch Wheels   Patient needs a walker to treat with the following condition S/P AVR (aortic valve replacement)      06/20/22 0741            Follow-up Information     [facility] [place] Follow up on 06/27/2022.   Specialty: Cardiothoracic Surgery Why: Appintment is at 10:30, For suture removal Contact information: [place], [place] [phone]        [facility] [place] Follow up on 07/16/2022.   Specialty: Cardiothoracic Surgery Why: Please get CXR at 1:30 at [place] [facility] this is on the first floor of our office building... Your  appointment is at 2:00 with Dr. Vivi Martens office Contact information: [place], [place], NP Follow up on 07/09/2022.   Specialties: Nurse Practitioner, Family Medicine Why: Appointment is at 10:55 Contact information: [address] [place] [address] [phone]                 Signed:  Ellwood Handler, PA-C  06/21/2022, 10:24 AM

## 2022-06-18 NOTE — Progress Notes (Signed)
EVENING ROUNDS NOTE :     [address]       [place] [address]             [phone]                 1 Day Post-Op Procedure(s) (LRB): AORTIC VALVE REPLACEMENT USING AN INSPIRIS VALVE (N/A) TRANSESOPHAGEAL ECHOCARDIOGRAM (TEE) (N/A)   Total Length of Stay:  LOS: 1 day  Events:   No events Up to chair     BP 107/62   Pulse 80   Temp 97.7 F (36.5 C)   Resp 20   Ht 5\' 7"  (1.702 m)   Wt 81.8 kg   SpO2 90%   BMI 28.24 kg/m   PAP: (20-37)/(10-19) 37/19 CO:  [3.7 L/min-5.4 L/min] 5.4 L/min CI:  [2 L/min/m2-2.8 L/min/m2] 2.8 L/min/m2  Vent Mode: SIMV;PRVC;PSV FiO2 (%):  [40 %-50 %] 40 % Set Rate:  [4 bmp-12 bmp] 4 bmp Vt Set:  [520 mL] 520 mL PEEP:  [5 cmH20] 5 cmH20 Pressure Support:  [10 cmH20] 10 cmH20 Plateau Pressure:  [10 cmH20-13 cmH20] 10 cmH20   sodium chloride Stopped (06/17/22 1438)   sodium chloride     sodium chloride 10 mL/hr at 06/18/22 1400    ceFAZolin (ANCEF) IV 2 g (06/18/22 1545)   lactated ringers Stopped (06/17/22 1625)   lactated ringers Stopped (06/18/22 1124)   niCARDipine 5 mg/hr (06/18/22 1400)    I/O last 3 completed shifts: In: 7135.8 [I.V.:4140.7; Blood:649; IV Piggyback:2346.1] Out: 8736 [Urine:7490; Blood:974; Chest Tube:272]      Latest Ref Rng & Units 06/18/2022    5:09 AM 06/18/2022    1:27 AM 06/18/2022   12:14 AM  CBC  WBC 4.0 - 10.5 K/uL 13.4     Hemoglobin 13.0 - 17.0 g/dL 56.3  87.5  64.3   Hematocrit 39.0 - 52.0 % 33.2  33.0  33.0   Platelets 150 - 400 K/uL 109          Latest Ref Rng & Units 06/18/2022    5:09 AM 06/18/2022    1:27 AM 06/18/2022   12:14 AM  BMP  Glucose 70 - 99 mg/dL 329     BUN 6 - 20 mg/dL 16     Creatinine 5.18 - 1.24 mg/dL 8.41     Sodium 660 - 630 mmol/L 140  141  142   Potassium 3.5 - 5.1 mmol/L 4.1  3.7  3.5   Chloride 98 - 111 mmol/L 105     CO2 22 - 32 mmol/L 24     Calcium 8.9 - 10.3 mg/dL 7.9       ABG    Component Value Date/Time   PHART 7.369  06/18/2022 0127   PCO2ART 40.4 06/18/2022 0127   PO2ART 78 (L) 06/18/2022 0127   HCO3 23.6 06/18/2022 0127   TCO2 25 06/18/2022 0127   ACIDBASEDEF 2.0 06/18/2022 0127   O2SAT 96 06/18/2022 0127       Brynda Greathouse, MD 06/18/2022 4:11 PM

## 2022-06-19 ENCOUNTER — Inpatient Hospital Stay (HOSPITAL_COMMUNITY): Payer: 59

## 2022-06-19 LAB — CBC
HCT: 32 % — ABNORMAL LOW (ref 39.0–52.0)
Hemoglobin: 10.9 g/dL — ABNORMAL LOW (ref 13.0–17.0)
MCH: 29.9 pg (ref 26.0–34.0)
MCHC: 34.1 g/dL (ref 30.0–36.0)
MCV: 87.9 fL (ref 80.0–100.0)
Platelets: 94 10*3/uL — ABNORMAL LOW (ref 150–400)
RBC: 3.64 MIL/uL — ABNORMAL LOW (ref 4.22–5.81)
RDW: 13.8 % (ref 11.5–15.5)
WBC: 12 10*3/uL — ABNORMAL HIGH (ref 4.0–10.5)
nRBC: 0 % (ref 0.0–0.2)

## 2022-06-19 LAB — BASIC METABOLIC PANEL
Anion gap: 7 (ref 5–15)
BUN: 20 mg/dL (ref 6–20)
CO2: 29 mmol/L (ref 22–32)
Calcium: 8.2 mg/dL — ABNORMAL LOW (ref 8.9–10.3)
Chloride: 102 mmol/L (ref 98–111)
Creatinine, Ser: 0.88 mg/dL (ref 0.61–1.24)
GFR, Estimated: >60 mL/min (ref >60)
Glucose, Bld: 102 mg/dL — ABNORMAL HIGH (ref 70–99)
Potassium: 4 mmol/L (ref 3.5–5.1)
Sodium: 138 mmol/L (ref 135–145)

## 2022-06-19 LAB — GLUCOSE, CAPILLARY
Glucose-Capillary: 111 mg/dL — ABNORMAL HIGH (ref 70–99)
Glucose-Capillary: 123 mg/dL — ABNORMAL HIGH (ref 70–99)

## 2022-06-19 MED ORDER — ONDANSETRON HCL 4 MG PO TABS: 4.0000 mg | ORAL_TABLET | Freq: Four times a day (QID) | ORAL | Status: DC | PRN

## 2022-06-19 MED ORDER — METOPROLOL TARTRATE 25 MG PO TABS
25.0000 mg | ORAL_TABLET | Freq: Two times a day (BID) | ORAL | Status: DC
Start: 2022-06-19 — End: 2022-06-21
  Administered 2022-06-19 – 2022-06-21 (×4): 25 mg via ORAL
  Filled 2022-06-19 (×4): qty 1

## 2022-06-19 MED ORDER — SODIUM CHLORIDE 0.9% FLUSH
3.0000 mL | INTRAVENOUS | Status: DC | PRN
  Administered 2022-06-19: 3 mL via INTRAVENOUS

## 2022-06-19 MED ORDER — METOLAZONE 2.5 MG PO TABS
2.5000 mg | ORAL_TABLET | Freq: Once | ORAL | Status: AC
  Administered 2022-06-19: 2.5 mg via ORAL
  Filled 2022-06-19: qty 1

## 2022-06-19 MED ORDER — DOCUSATE SODIUM 100 MG PO CAPS
200.0000 mg | ORAL_CAPSULE | Freq: Every day | ORAL | Status: DC
Start: 2022-06-19 — End: 2022-06-21
  Administered 2022-06-19 – 2022-06-21 (×3): 200 mg via ORAL
  Filled 2022-06-19 (×3): qty 2

## 2022-06-19 MED ORDER — BISACODYL 10 MG RE SUPP: 10.0000 mg | Freq: Every day | RECTAL | Status: DC | PRN

## 2022-06-19 MED ORDER — BISACODYL 5 MG PO TBEC: 10.0000 mg | DELAYED_RELEASE_TABLET | Freq: Every day | ORAL | Status: DC | PRN

## 2022-06-19 MED ORDER — ONDANSETRON HCL 4 MG/2ML IJ SOLN: 4.0000 mg | Freq: Four times a day (QID) | INTRAMUSCULAR | Status: DC | PRN

## 2022-06-19 MED ORDER — SODIUM CHLORIDE 0.9% FLUSH
3.0000 mL | Freq: Two times a day (BID) | INTRAVENOUS | Status: DC
Start: 2022-06-19 — End: 2022-06-21
  Administered 2022-06-19 – 2022-06-20 (×3): 3 mL via INTRAVENOUS

## 2022-06-19 MED ORDER — SODIUM CHLORIDE 0.9 % IV SOLN: 250.0000 mL | INTRAVENOUS | Status: DC | PRN

## 2022-06-19 MED ORDER — ~~LOC~~ CARDIAC SURGERY, PATIENT & FAMILY EDUCATION: Freq: Once | Status: AC

## 2022-06-19 MED ORDER — ASPIRIN 325 MG PO TBEC
325.0000 mg | DELAYED_RELEASE_TABLET | Freq: Every day | ORAL | Status: DC
  Administered 2022-06-19 – 2022-06-21 (×3): 325 mg via ORAL
  Filled 2022-06-19 (×3): qty 1

## 2022-06-19 MED ORDER — POTASSIUM CHLORIDE CRYS ER 20 MEQ PO TBCR
20.0000 meq | EXTENDED_RELEASE_TABLET | Freq: Two times a day (BID) | ORAL | Status: AC
  Administered 2022-06-19 – 2022-06-20 (×3): 20 meq via ORAL
  Filled 2022-06-19 (×4): qty 1

## 2022-06-19 MED ORDER — FUROSEMIDE 40 MG PO TABS
40.0000 mg | ORAL_TABLET | Freq: Every day | ORAL | Status: AC
  Administered 2022-06-19: 40 mg via ORAL
  Filled 2022-06-19 (×2): qty 1

## 2022-06-19 MED FILL — Heparin Sodium (Porcine) Inj 1000 Unit/ML: Qty: 1000 | Status: AC

## 2022-06-19 MED FILL — Lidocaine HCl Local Preservative Free (PF) Inj 2%: INTRAMUSCULAR | Qty: 15 | Status: AC

## 2022-06-19 MED FILL — Potassium Chloride Inj 2 mEq/ML: INTRAVENOUS | Qty: 40 | Status: AC

## 2022-06-19 NOTE — Progress Notes (Signed)
2 Days Post-Op Procedure(s) (LRB): AORTIC VALVE REPLACEMENT USING AN INSPIRIS 23MM VALVE (N/A) TRANSESOPHAGEAL ECHOCARDIOGRAM (TEE) (N/A) Subjective: No complaints.  Objective: Vital signs in last 24 hours: Temp:  [98 F (36.7 C)-98.7 F (37.1 C)] 98.2 F (36.8 C) (06/28 0751) Pulse Rate:  [71-82] 77 (06/28 0700) Cardiac Rhythm: Normal sinus rhythm (06/28 0400) Resp:  [0-20] 17 (06/28 0700) BP: (99-136)/(60-77) 136/77 (06/28 0700) SpO2:  [90 %-99 %] 97 % (06/28 0700) Arterial Line BP: (99-135)/(35-73) 135/73 (06/27 1700) Weight:  [81.8 kg] 81.8 kg (06/28 0500)  Hemodynamic parameters for last 24 hours:    Intake/Output from previous day: 06/27 0701 - 06/28 0700 In: 1598.5 [P.O.:360; I.V.:758.4; IV Piggyback:480.1] Out: 2125 [Urine:2075; Chest Tube:50] Intake/Output this shift: No intake/output data recorded.  General appearance: alert and cooperative Neurologic: intact Heart: regular rate and rhythm, S1, S2 normal, no murmur Lungs: clear to auscultation bilaterally Extremities: edema mild Wound: dressing dry  Lab Results: Recent Labs    06/18/22 1624 06/19/22 0403  WBC 15.3* 12.0*  HGB 10.6* 10.9*  HCT 31.8* 32.0*  PLT 111* 94*   BMET:  Recent Labs    06/18/22 1624 06/19/22 0403  NA 139 138  K 3.6 4.0  CL 104 102  CO2 22 29  GLUCOSE 168* 102*  BUN 21* 20  CREATININE 1.17 0.88  CALCIUM 8.0* 8.2*    PT/INR:  Recent Labs    06/17/22 1450  LABPROT 12.4  INR 0.9   ABG    Component Value Date/Time   PHART 7.369 06/18/2022 0127   HCO3 23.6 06/18/2022 0127   TCO2 25 06/18/2022 0127   ACIDBASEDEF 2.0 06/18/2022 0127   O2SAT 96 06/18/2022 0127   CBG (last 3)  Recent Labs    06/18/22 2352 06/19/22 0404 06/19/22 0648  GLUCAP 128* 111* 123*   CXR: clear  Assessment/Plan: S/P Procedure(s) (LRB): AORTIC VALVE REPLACEMENT USING AN INSPIRIS 23MM VALVE (N/A) TRANSESOPHAGEAL ECHOCARDIOGRAM (TEE) (N/A)  POD 2 Hemodynamically stable in sinus  rhythm. Continue Lopressor.  Volume excess: wt is 5 lbs over preop. Continue diuresis.  Glucose under good control with no hx of DM. DC CBG's  DC sleeve and foley.  Transfer to 4E and continue mobilization.  No Lovenox since plts still on low side. Continue SCD's.   LOS: 2 days    Gaye Pollack 06/19/2022

## 2022-06-19 NOTE — Progress Notes (Signed)
  Transition of Care [facility]) Screening Note   Patient Details  Name: Darren Lyons Date of Birth: 05-Aug-1966   Transition of Care [facility]) CM/SW Contact:    Cyndi Bender, RN Phone Number: 06/19/2022, 9:29 AM    Transition of Care Department [facility]) has reviewed patient and no TOC needs have been identified at this time. We will continue to monitor patient advancement through interdisciplinary progression rounds. If new patient transition needs arise, please place a TOC consult.

## 2022-06-19 NOTE — Progress Notes (Signed)
CARDIAC REHAB PHASE I   PRE:  Rate/Rhythm: 90 SR    BP: sitting 131/77    SaO2: 97 3L  MODE:  Ambulation: 370 ft   POST:  Rate/Rhythm: 97 SR    BP: sitting 159/75     SaO2: 93 RA  Pt moved out of bed and to [facility] with verbal cues, moving well. Walked with RW, slow and steady. Able to d/c O2 before walking. No c/o. Pt back to bed and SaO2 to 90 RA therefore left cannula close for him to reapply if needed. Encouraged x1 more walk and IS. Wife with appropriate d/c questions.  [place], ACSM 06/19/2022 2:11 PM

## 2022-06-20 ENCOUNTER — Telehealth: Payer: Self-pay

## 2022-06-20 MED ORDER — LOSARTAN POTASSIUM 25 MG PO TABS
25.0000 mg | ORAL_TABLET | Freq: Every day | ORAL | Status: DC
  Administered 2022-06-20 – 2022-06-21 (×2): 25 mg via ORAL
  Filled 2022-06-20 (×2): qty 1

## 2022-06-20 NOTE — Progress Notes (Signed)
CARDIAC REHAB PHASE I    D/C education discussed with pt and wife.Teaching included move in tube, restrictions, incision care, IS use,heart healthy diet, vape cessation, and CRP2. Pt declined referral for CRP2as he prefers exercise on his own. All questions and concerns addressed. Gave pt D/C video information.  [id number]   Vanessa Barbara, RN BSN 06/20/2022 2:29 PM

## 2022-06-20 NOTE — Progress Notes (Signed)
CARDIAC REHAB PHASE I   PRE:  Rate/Rhythm: 80 SR  BP:  Sitting: 144/83      SaO2: 96 RA  MODE:  Ambulation: 270 ft   POST:  Rate/Rhythm: 96 SR  BP:  Sitting: 155/90      SaO2: 97 RA  Ambulated in hall. No SOB noted. Back to room sitting in chair with call bell in reach. Questions and concerns addressed. Will continue to follow.  [id number]  Vanessa Barbara, RN BSN 06/20/2022 12:03 PM

## 2022-06-20 NOTE — Telephone Encounter (Signed)
FMLA form completed and faxed to [phone]. LOA begins 06/17/22 through 09/16/22/form mailed to home address

## 2022-06-20 NOTE — Progress Notes (Addendum)
      [address]       [place] [address]             [phone]      3 Days Post-Op Procedure(s) (LRB): AORTIC VALVE REPLACEMENT USING AN INSPIRIS 23MM VALVE (N/A) TRANSESOPHAGEAL ECHOCARDIOGRAM (TEE) (N/A)  Subjective:  Patient w/o complaints.  He isn't eating much which he states isn't new to him since he had COVID in the past.  + ambulation   Objective: Vital signs in last 24 hours: Temp:  [97.8 F (36.6 C)-98.7 F (37.1 C)] 98.2 F (36.8 C) (06/29 0511) Pulse Rate:  [76-89] 80 (06/29 0511) Cardiac Rhythm: Normal sinus rhythm (06/29 0405) Resp:  [15-20] 17 (06/29 0511) BP: (114-156)/(66-90) 156/84 (06/29 0511) SpO2:  [86 %-99 %] 93 % (06/29 0511) Weight:  [77.7 kg] 77.7 kg (06/29 0511)  Intake/Output from previous day: 06/28 0701 - 06/29 0700 In: 280 [P.O.:240; I.V.:40] Out: 3460 [Urine:3460]  General appearance: alert, cooperative, and no distress Heart: regular rate and rhythm Lungs: clear to auscultation bilaterally Abdomen: soft, non-tender; bowel sounds normal; no masses,  no organomegaly Extremities: extremities normal, atraumatic, no cyanosis or edema Wound: clean and dry  Lab Results: Recent Labs    06/18/22 1624 06/19/22 0403  WBC 15.3* 12.0*  HGB 10.6* 10.9*  HCT 31.8* 32.0*  PLT 111* 94*   BMET:  Recent Labs    06/18/22 1624 06/19/22 0403  NA 139 138  K 3.6 4.0  CL 104 102  CO2 22 29  GLUCOSE 168* 102*  BUN 21* 20  CREATININE 1.17 0.88  CALCIUM 8.0* 8.2*    PT/INR:  Recent Labs    06/17/22 1450  LABPROT 12.4  INR 0.9   ABG    Component Value Date/Time   PHART 7.369 06/18/2022 0127   HCO3 23.6 06/18/2022 0127   TCO2 25 06/18/2022 0127   ACIDBASEDEF 2.0 06/18/2022 0127   O2SAT 96 06/18/2022 0127   CBG (last 3)  Recent Labs    06/18/22 2352 06/19/22 0404 06/19/22 0648  GLUCAP 128* 111* 123*    Assessment/Plan: S/P Procedure(s) (LRB): AORTIC VALVE REPLACEMENT USING AN INSPIRIS 23MM VALVE  (N/A) TRANSESOPHAGEAL ECHOCARDIOGRAM (TEE) (N/A)  CV- NSR, + HTN- continue Lopressor, will add low dose Cozaar Pulm- off oxygen, no acute issues, continue IS Renal- creatinine stable, weight is below baseline, can likely stop diuretics Thrombocytopenia- stable, no Lovenox, continue SCDs, repeat CBC in AM DIspo- patient stable, add low dose Cozaar for BP control, will d/c EPW today, weight is below admission can likely stop diuretics, if remains clinically stable for discharge home in AM   LOS: 3 days   Ellwood Handler, PA-C 06/20/2022   Chart reviewed, patient examined, agree with above. He looks good POD 3. Rhythm has been stable in sinus. Plan home tomorrow if no changes.

## 2022-06-21 LAB — CBC
HCT: 33.7 % — ABNORMAL LOW (ref 39.0–52.0)
Hemoglobin: 11.7 g/dL — ABNORMAL LOW (ref 13.0–17.0)
MCH: 29.5 pg (ref 26.0–34.0)
MCHC: 34.7 g/dL (ref 30.0–36.0)
MCV: 85.1 fL (ref 80.0–100.0)
Platelets: 141 10*3/uL — ABNORMAL LOW (ref 150–400)
RBC: 3.96 MIL/uL — ABNORMAL LOW (ref 4.22–5.81)
RDW: 13.1 % (ref 11.5–15.5)
WBC: 10.6 10*3/uL — ABNORMAL HIGH (ref 4.0–10.5)
nRBC: 0 % (ref 0.0–0.2)

## 2022-06-21 MED ORDER — TRAMADOL HCL 50 MG PO TABS: 50.0000 mg | ORAL_TABLET | ORAL | 0 refills | Status: DC | PRN

## 2022-06-21 MED ORDER — POTASSIUM CHLORIDE CRYS ER 20 MEQ PO TBCR: 20.0000 meq | EXTENDED_RELEASE_TABLET | Freq: Every day | ORAL | 1 refills | Status: AC | PRN

## 2022-06-21 MED ORDER — LOSARTAN POTASSIUM 25 MG PO TABS: 25.0000 mg | ORAL_TABLET | Freq: Every day | ORAL | 3 refills | Status: DC

## 2022-06-21 MED ORDER — ACETAMINOPHEN 500 MG PO TABS: 500.0000 mg | ORAL_TABLET | Freq: Four times a day (QID) | ORAL | 0 refills | Status: AC

## 2022-06-21 MED ORDER — ASPIRIN 325 MG PO TBEC: 325.0000 mg | DELAYED_RELEASE_TABLET | Freq: Every day | ORAL | Status: DC

## 2022-06-21 MED ORDER — FUROSEMIDE 40 MG PO TABS: 40.0000 mg | ORAL_TABLET | Freq: Every day | ORAL | 1 refills | Status: AC | PRN

## 2022-06-21 NOTE — Progress Notes (Addendum)
      [address]       [place] [address]             [phone]      4 Days Post-Op Procedure(s) (LRB): AORTIC VALVE REPLACEMENT USING AN INSPIRIS 23MM VALVE (N/A) TRANSESOPHAGEAL ECHOCARDIOGRAM (TEE) (N/A)  Subjective:  Patient didn't sleep well last night.  He took a nap yesterday and wasn't able to sleep that well.  He is happy to be going home.  Objective: Vital signs in last 24 hours: Temp:  [98.1 F (36.7 C)-98.4 F (36.9 C)] 98.1 F (36.7 C) (06/30 0432) Pulse Rate:  [78-91] 79 (06/30 0432) Cardiac Rhythm: Normal sinus rhythm (06/29 2046) Resp:  [14-19] 15 (06/30 0432) BP: (130-155)/(71-90) 138/89 (06/30 0432) SpO2:  [94 %-98 %] 97 % (06/30 0432) Weight:  [77.7 kg] 77.7 kg (06/30 0432)  Intake/Output from previous day: 06/29 0701 - 06/30 0700 In: -  Out: 700 [Urine:700]  General appearance: alert, cooperative, and no distress Heart: regular rate and rhythm Lungs: clear to auscultation bilaterally Abdomen: soft, non-tender; bowel sounds normal; no masses,  no organomegaly Extremities: extremities normal, atraumatic, no cyanosis or edema Wound: clean andd ry  Lab Results: Recent Labs    06/19/22 0403 06/21/22 0134  WBC 12.0* 10.6*  HGB 10.9* 11.7*  HCT 32.0* 33.7*  PLT 94* 141*   BMET:  Recent Labs    06/18/22 1624 06/19/22 0403  NA 139 138  K 3.6 4.0  CL 104 102  CO2 22 29  GLUCOSE 168* 102*  BUN 21* 20  CREATININE 1.17 0.88  CALCIUM 8.0* 8.2*    PT/INR: No results for input(s): "LABPROT", "INR" in the last 72 hours. ABG    Component Value Date/Time   PHART 7.369 06/18/2022 0127   HCO3 23.6 06/18/2022 0127   TCO2 25 06/18/2022 0127   ACIDBASEDEF 2.0 06/18/2022 0127   O2SAT 96 06/18/2022 0127   CBG (last 3)  Recent Labs    06/18/22 2352 06/19/22 0404 06/19/22 0648  GLUCAP 128* 111* 123*    Assessment/Plan: S/P Procedure(s) (LRB): AORTIC VALVE REPLACEMENT USING AN INSPIRIS 23MM VALVE (N/A) TRANSESOPHAGEAL  ECHOCARDIOGRAM (TEE) (N/A)  CV- NSR, HTN improved- continue Lopressor, Cozaar Pulm- no acute issues, off oxygen Renal- weight is below baseline, will give lasix prn at discharge Thrombocytopenia-improving up to 141 Dispo- patient stable, will d/c home today   LOS: 4 days    Ellwood Handler, PA-C 06/21/2022   Chart reviewed, patient examined, agree with above. He feels well and has been stable in sinus rhythm. Plan home today with wife.

## 2022-06-21 NOTE — TOC Transition Note (Signed)
Transition of Care (TOC) - CM/SW Discharge Note Marvetta Gibbons RN, BSN Transitions of Care Unit 4E- RN Case Manager See Treatment Team for direct phone #    Patient Details  Name: Darren Lyons MRN: [id number] Date of Birth: Jan 28, 1966  Transition of Care [facility]) CM/SW Contact:  Dawayne Patricia, RN Phone Number: 06/21/2022, 11:23 AM   Clinical Narrative:    Pt stable for transition home today, bedside RN has confirmed RW delivered to the room for home.  No further TOC needs noted. Pt has transportation home.    Final next level of care: Home/Self Care Barriers to Discharge: No Barriers Identified   Patient Goals and CMS Choice      DME- in house provider  Discharge Placement                 Home      Discharge Plan and Services   Discharge Planning Services: CM Consult Post Acute Care Choice: Durable Medical Equipment          DME Arranged: Walker rolling DME Agency: [employer] Date DME Agency Contacted: 06/20/22 Time DME Agency Contacted: [phone] Representative spoke with at DME Agency: [place] ([place] Interventions     Readmission Risk Interventions    06/21/2022   11:23 AM  Readmission Risk Prevention Plan  Post Dischage Appt Complete  Medication Screening Complete  Transportation Screening Complete

## 2022-06-21 NOTE — Progress Notes (Signed)
CARDIAC REHAB PHASE I   Pt seen ambulating independently with wife and front wheel walker in hallway. Reinforced d/c education including site care, restrictions, IS use, and exercise guidelines. Deny further questions and concerns. Pt declines CRP II at this time.   [id number] Rufina Falco, RN BSN 06/21/2022 8:22 AM

## 2022-06-24 ENCOUNTER — Telehealth: Payer: Self-pay

## 2022-06-24 NOTE — Telephone Encounter (Signed)
Transition Care Management Follow-up Telephone Call Date of discharge and from where: Darren Lyons 06/17/22-06/21/22 How have you been since you were released from the hospital? Per spouse, patient is doing ok, he has had little pain. Any questions or concerns? Yes- Patient is having difficulty sleeping since he was discharged.  Per spouse he is not taking pain medication and perhaps he should try a pain pill so he is comfortable when he tries to rest.  Spouse to discuss with patient. Items Reviewed: Did the pt receive and understand the discharge instructions provided? Yes  Medications obtained and verified? Yes  Other? No  Any new allergies since your discharge? No  Dietary orders reviewed? Yes Do you have support at home? Yes -Very attentive spouse  Home Care and Equipment/Supplies: Were home health services ordered? no If so, what is the name of the agency? N/A  Has the agency set up a time to come to the patient's home? not applicable Were any new equipment or medical supplies ordered?  Yes: [facility] What is the name of the medical supply agency? Received at hospital Were you able to get the supplies/equipment? yes Do you have any questions related to the use of the equipment or supplies? No  Functional Questionnaire: (I = Independent and D = Dependent) ADLs: I  Bathing/Dressing- I  Meal Prep- I  Eating- I  Maintaining continence- I  Transferring/Ambulation- I  Managing Meds- I  Follow up appointments reviewed:  PCP Hospital f/u appt confirmed? No  Specialist Hospital f/u appt confirmed? Yes  Scheduled to see Diona Browner, PA on 07/09/22 and suture removal on 06/27/22 @ 10:30. Are transportation arrangements needed? No  If their condition worsens, is the pt aware to call PCP or go to the Emergency Dept.? Yes Was the patient provided with contact information for the PCP's office or ED? Yes Was to pt encouraged to call back with questions or concerns? Yes  Johnney Killian, RN,  BSN, CCM Care Management Coordinator [facility] Phone: [phone]: [phone]

## 2022-06-27 ENCOUNTER — Encounter (INDEPENDENT_AMBULATORY_CARE_PROVIDER_SITE_OTHER): Payer: Self-pay

## 2022-06-27 DIAGNOSIS — Z4802 Encounter for removal of sutures: Secondary | ICD-10-CM

## 2022-07-09 ENCOUNTER — Ambulatory Visit: Payer: 59 | Admitting: Nurse Practitioner

## 2022-07-09 ENCOUNTER — Encounter: Payer: Self-pay | Admitting: Nurse Practitioner

## 2022-07-09 VITALS — BP 138/82 | HR 77 | Ht 67.0 in | Wt 176.8 lb

## 2022-07-09 DIAGNOSIS — I10 Essential (primary) hypertension: Secondary | ICD-10-CM

## 2022-07-09 DIAGNOSIS — Q2381 Bicuspid aortic valve: Secondary | ICD-10-CM

## 2022-07-09 DIAGNOSIS — Z952 Presence of prosthetic heart valve: Secondary | ICD-10-CM

## 2022-07-09 DIAGNOSIS — Q231 Congenital insufficiency of aortic valve: Secondary | ICD-10-CM

## 2022-07-09 DIAGNOSIS — I2510 Atherosclerotic heart disease of native coronary artery without angina pectoris: Secondary | ICD-10-CM | POA: Diagnosis not present

## 2022-07-09 DIAGNOSIS — E785 Hyperlipidemia, unspecified: Secondary | ICD-10-CM

## 2022-07-09 DIAGNOSIS — I350 Nonrheumatic aortic (valve) stenosis: Secondary | ICD-10-CM

## 2022-07-09 MED ORDER — NITROGLYCERIN 0.4 MG SL SUBL: 0.4000 mg | SUBLINGUAL_TABLET | SUBLINGUAL | 3 refills | Status: AC | PRN

## 2022-07-09 MED ORDER — AMOXICILLIN 500 MG PO CAPS
ORAL_CAPSULE | ORAL | 3 refills | Status: AC
Start: 2022-07-09 — End: ?

## 2022-07-09 NOTE — Patient Instructions (Addendum)
Medication Instructions:  Amoxicillin take four 500 mg capsules as needed 30-60 minutes prior to dental procedures.   *If you need a refill on your cardiac medications before your next appointment, please call your pharmacy*   Lab Work: Your physician recommends that you complete lab work today [place], BMET  If you have labs (blood work) drawn today and your tests are completely normal, you will receive your results only by: MyChart Message (if you have MyChart) OR A paper copy in the mail If you have any lab test that is abnormal or we need to change your treatment, we will call you to review the results.   Testing/Procedures: NONE ordered at this time of appointment     Follow-Up: At [facility], you and your health needs are our priority.  As part of our continuing mission to provide you with exceptional heart care, we have created designated Provider Care Teams.  These Care Teams include your primary Cardiologist (physician) and Advanced Practice Providers (APPs -  Physician Assistants and Nurse Practitioners) who all work together to provide you with the care you need, when you need it.  We recommend signing up for the patient portal called "MyChart".  Sign up information is provided on this After Visit Summary.  MyChart is used to connect with patients for Virtual Visits (Telemedicine).  Patients are able to view lab/test results, encounter notes, upcoming appointments, etc.  Non-urgent messages can be sent to your provider as well.   To learn more about what you can do with MyChart, go to [email or web address].    Your next appointment:   3-4 month(s)  The format for your next appointment:   In Person  Provider:   Shelva Majestic, MD  or Diona Browner, NP        Other Instructions   Important Information About Sugar

## 2022-07-09 NOTE — Progress Notes (Signed)
Office Visit    Patient Name: Darren Lyons Date of Encounter: 07/09/2022  Primary Care Provider:  Marge Duncans, PA-C Primary Cardiologist:  Shelva Majestic, MD  Chief Complaint    56 year old male with a history of severe aortic valve stenosis s/p aortic valve replacement, CAD, hypertension, hyperlipidemia GERD, and depression who presents for hospital follow-up to aortic stenosis s/p AVR.  Past Medical History    Past Medical History:  Diagnosis Date   Depression    Essential hypertension 09/19/2020   GERD (gastroesophageal reflux disease)    Hyperlipidemia    Hypertension    Migraine    Mixed hyperlipidemia 09/19/2020   Other chest pain 09/19/2020   Other fatigue 09/19/2020   Skin cancer of arm, right    Past Surgical History:  Procedure Laterality Date   AORTIC VALVE REPLACEMENT N/A 06/17/2022   Procedure: AORTIC VALVE REPLACEMENT USING AN INSPIRIS 23MM VALVE;  Surgeon: Gaye Pollack, MD;  Location: [facility];  Service: Open Heart Surgery;  Laterality: N/A;   APPENDECTOMY     LEFT HEART CATH AND CORONARY ANGIOGRAPHY N/A 10/02/2020   Procedure: LEFT HEART CATH AND CORONARY ANGIOGRAPHY;  Surgeon: Troy Sine, MD;  Location: [facility];  Service: Cardiovascular;  Laterality: N/A;   RIGHT HEART CATH N/A 10/02/2020   Procedure: RIGHT HEART CATH;  Surgeon: Troy Sine, MD;  Location: [facility];  Service: Cardiovascular;  Laterality: N/A;   RIGHT HEART CATH AND CORONARY ANGIOGRAPHY N/A 05/07/2022   Procedure: RIGHT HEART CATH AND CORONARY ANGIOGRAPHY;  Surgeon: Troy Sine, MD;  Location: [facility];  Service: Cardiovascular;  Laterality: N/A;   TEE WITHOUT CARDIOVERSION N/A 06/17/2022   Procedure: TRANSESOPHAGEAL ECHOCARDIOGRAM (TEE);  Surgeon: Gaye Pollack, MD;  Location: [facility];  Service: Open Heart Surgery;  Laterality: N/A;    Allergies  Allergies  Allergen Reactions   Codeine Rash    History of Present Illness    56 year old male  with the above past medical history of severe aortic valve stenosis s/p valve replacement, CAD, hypertension, hyperlipidemia GERD, and depression.  He was previously evaluated by Dr. Geraldo Pitter in October 2021.  He was referred to Dr. Claiborne Billings for diagnostic cardiac catheterization in the setting of episodic chest pain which revealed aortic valve stenosis.  Right heart pressures were upper normal and his aortic valve peak to peak gradient was 43 mmHg with mean gradient of 42 mmHg.  He was found to have mild nonobstructive CAD with 20% stenosis in the LAD, circumflex marginal, and RCA.  Echocardiogram in October 2022 showed EF 60 to 65%, mild LVH, G2 DD, mild biatrial enlargement, suggestion of bicuspid aortic valve with moderate to severe stenosis with a mean gradient of 8.6 and peak gradient of 65.8, mild dilation of ascending aorta 39 mm.  He also underwent amyloid study which was equivocal.  Repeat echocardiogram in April 2023 showed normal LV function, EF 65 to 70%, moderate LVH, significant calcification of the aortic valve with mean gradient 42 mmHg, aortic valve peak gradient 75.7 mmHg. He was last seen in the office on 05/01/2022 and noted exertional dyspnea status of daytime sleepiness.  He underwent repeat R/LHC on 05/07/2022 which revealed mild nonobstructive CAD with 20% proximal LAD and mid RCA stenoses, focal 70% stenosis in the OM (small caliber vessel), mildly increased right heart pressures with PA pressure 38/13, mean 20 mmHg, heavily calcified aortic valve with reduced excursion, mean gradient 42 mmHg, with peak instantaneous gradient 75.7 mmHg.  He was referred to CT surgery and underwent aortic valve replacement with an Inspiris Resilia Biprosthetic 23 mm valve on 06/17/2022.  Presurgery Dopplers revealed 1 to 39% B ICA stenosis.  He was hospitalized from 06/17/2022 to 06/21/2022 in the setting of aortic stenosis s/p AVR.  Postoperatively he was hypertensive and required Cardene drip.  Metoprolol was  increased he was started on low-dose losartan.  He was discharged home in stable condition on 06/21/2022.  He presents today for follow-up made by his wife and granddaughter.  Since his discharge from the hospital he has done well overall from a cardiac standpoint.  He notes some soreness, swelling in his left breast area.  He denies any chest pain, dyspnea, edema, PND, orthopnea.  Other than his soreness in his chest and mild swelling, he denies any additional concerns today.  Home Medications    Current Outpatient Medications  Medication Sig Dispense Refill   acetaminophen (TYLENOL) 500 MG tablet Take 1-2 tablets (500-1,000 mg total) by mouth every 6 (six) hours. 30 tablet 0   amoxicillin (AMOXIL) 500 MG capsule Take four 500 mg capsules 30-60 mins prior to dental procedures. 4 capsule 3   aspirin EC 325 MG tablet Take 1 tablet (325 mg total) by mouth daily.     atenolol (TENORMIN) 50 MG tablet Take 1 tablet (50 mg total) by mouth daily. 90 tablet 3   fluticasone (FLONASE) 50 MCG/ACT nasal spray PLACE 2 SPRAYS INTO BOTH NOSTRILS DAILY AS NEEDED FOR ALLERGIES OR RHINITIS 48 g 0   losartan (COZAAR) 25 MG tablet Take 1 tablet (25 mg total) by mouth daily. 30 tablet 3   omeprazole (PRILOSEC) 40 MG capsule Take 1 capsule by mouth once daily 90 capsule 0   PARoxetine (PAXIL) 10 MG tablet Take 1 tablet (10 mg total) by mouth daily. 90 tablet 1   potassium chloride SA (KLOR-CON M) 20 MEQ tablet Take 1 tablet (20 mEq total) by mouth daily as needed. Only take on days you take a Lasix 30 tablet 1   rosuvastatin (CRESTOR) 10 MG tablet Take 1 tablet by mouth once daily 90 tablet 0   traMADol (ULTRAM) 50 MG tablet Take 1-2 tablets (50-100 mg total) by mouth every 4 (four) hours as needed for moderate pain. 30 tablet 0   furosemide (LASIX) 40 MG tablet Take 1 tablet (40 mg total) by mouth daily as needed. For weight gain of 3 lbs in 24 hours or 5 lbs in 1 week (Patient not taking: Reported on 07/09/2022) 30  tablet 1   nitroGLYCERIN (NITROSTAT) 0.4 MG SL tablet Place 1 tablet (0.4 mg total) under the tongue every 5 (five) minutes as needed for chest pain. 25 tablet 3   No current facility-administered medications for this visit.     Review of Systems    He denies chest pain, palpitations, dyspnea, pnd, orthopnea, n, v, dizziness, syncope, edema, weight gain, or early satiety. All other systems reviewed and are otherwise negative except as noted above.   Physical Exam    VS:  BP 138/82   Pulse 77   Ht '5\' 7"'$  (1.702 m)   Wt 176 lb 12.8 oz (80.2 kg)   SpO2 98%   BMI 27.69 kg/m  GEN: Well nourished, well developed, in no acute distress. HEENT: normal. Neck: Supple, no JVD, carotid bruits, or masses. Cardiac: RRR, no murmurs, rubs, or gallops. No clubbing, cyanosis, edema.  Radials/DP/PT 2+ and equal bilaterally.  Respiratory:  Respirations regular and unlabored, clear to auscultation  bilaterally. GI: Soft, nontender, nondistended, BS + x 4. MS: no deformity or atrophy. Skin: warm and dry, no rash.  Midsternal incision clean, dry, intact and well approximated. Neuro:  Strength and sensation are intact. Psych: Normal affect.  Accessory Clinical Findings    ECG personally reviewed by me today - No EKG in office today.  Lab Results  Component Value Date   WBC 10.6 (H) 06/21/2022   HGB 11.7 (L) 06/21/2022   HCT 33.7 (L) 06/21/2022   MCV 85.1 06/21/2022   PLT 141 (L) 06/21/2022   Lab Results  Component Value Date   CREATININE 0.88 06/19/2022   BUN 20 06/19/2022   NA 138 06/19/2022   K 4.0 06/19/2022   CL 102 06/19/2022   CO2 29 06/19/2022   Lab Results  Component Value Date   ALT 15 06/13/2022   AST 18 06/13/2022   ALKPHOS 66 06/13/2022   BILITOT 0.7 06/13/2022   Lab Results  Component Value Date   CHOL 145 03/15/2022   HDL 42 03/15/2022   LDLCALC 88 03/15/2022   TRIG 76 03/15/2022   CHOLHDL 3.5 03/15/2022    Lab Results  Component Value Date   HGBA1C 5.4  06/13/2022    Assessment & Plan    1. Severe aortic stenosis: S/p AVR on 06/17/2022. Repeat echo scheduled for 07/30/2022.  He does note some soreness in his chest.  Euvolemic and well compensated on exam. He did have some mild postop anemia.  Will check CBC today. Discussed the use of prophylactic antibiotics before dental work and other surgeries. Prescription given for Amoxicillin 2 grams orally one hour  prior to dental procedures. Continue aspirin, will Lasix as needed.  Follow-up with CT surgery as scheduled.  2. CAD: LHC on 05/07/2022 revealed mild nonobstructive CAD with 20% proximal LAD and mid RCA stenoses, focal 70% stenosis in the OM (small caliber vessel). Stable with no anginal symptoms.  Continue aspirin, losartan, rosuvastatin.  3. Hypertension: BP well controlled. Continue current antihypertensive regimen.  We will check BMET today given new losartan.  4. Hyperlipidemia: LDL was 88 in 02/2022.  Continue rosuvastatin.  5. Disposition: Follow-up in 3-4 months.   Lenna Sciara, NP 07/09/2022, 4:38 PM

## 2022-07-10 LAB — CBC
Hematocrit: 36.1 % — ABNORMAL LOW (ref 37.5–51.0)
Hemoglobin: 12 g/dL — ABNORMAL LOW (ref 13.0–17.7)
MCH: 28.5 pg (ref 26.6–33.0)
MCHC: 33.2 g/dL (ref 31.5–35.7)
MCV: 86 fL (ref 79–97)
Platelets: 308 10*3/uL (ref 150–450)
RBC: 4.21 x10E6/uL (ref 4.14–5.80)
RDW: 13.3 % (ref 11.6–15.4)
WBC: 6.5 10*3/uL (ref 3.4–10.8)

## 2022-07-10 LAB — BASIC METABOLIC PANEL
BUN/Creatinine Ratio: 24 — ABNORMAL HIGH (ref 9–20)
BUN: 22 mg/dL (ref 6–24)
CO2: 25 mmol/L (ref 20–29)
Calcium: 9.5 mg/dL (ref 8.7–10.2)
Chloride: 103 mmol/L (ref 96–106)
Creatinine, Ser: 0.93 mg/dL (ref 0.76–1.27)
Glucose: 87 mg/dL (ref 70–99)
Potassium: 4.7 mmol/L (ref 3.5–5.2)
Sodium: 141 mmol/L (ref 134–144)
eGFR: 95 mL/min/{1.73_m2} (ref >59)

## 2022-07-15 ENCOUNTER — Other Ambulatory Visit: Payer: Self-pay | Admitting: Surgery

## 2022-07-15 DIAGNOSIS — Z952 Presence of prosthetic heart valve: Secondary | ICD-10-CM

## 2022-07-16 ENCOUNTER — Ambulatory Visit (INDEPENDENT_AMBULATORY_CARE_PROVIDER_SITE_OTHER): Payer: Self-pay | Admitting: Surgical

## 2022-07-16 ENCOUNTER — Ambulatory Visit
Admission: RE | Admit: 2022-07-16 | Discharge: 2022-07-16 | Disposition: A | Discharge status: Home / Self Care | Payer: 59 | Source: Ambulatory Visit | Attending: Thoracic Surgery (Cardiothoracic Vascular Surgery) | Admitting: Thoracic Surgery (Cardiothoracic Vascular Surgery)

## 2022-07-16 ENCOUNTER — Telehealth: Payer: Self-pay

## 2022-07-16 VITALS — BP 170/99 | HR 78 | Resp 20 | Ht 67.0 in | Wt 180.0 lb

## 2022-07-16 DIAGNOSIS — I350 Nonrheumatic aortic (valve) stenosis: Secondary | ICD-10-CM

## 2022-07-16 DIAGNOSIS — Z952 Presence of prosthetic heart valve: Secondary | ICD-10-CM

## 2022-07-16 NOTE — Telephone Encounter (Signed)
Spoke with pt. Pt was notified of results and will follow up as planned.

## 2022-07-16 NOTE — Progress Notes (Signed)
[address] SpringsSuite [address]       [place] [address]             [phone]      Darren Lyons Darren Lyons Medical Record #[id number] Date of Birth: 01/21/1966  Referring: Darren Sine, MD Primary Care: Darren Duncans, PA-C Primary Cardiologist: Darren Majestic, MD   Chief Complaint:   POST OP FOLLOW UP Surgeon:  Darren Pollack, MD   First Assistant: Darren Handler,  PA-C:   Preoperative Diagnosis:  Severe bicuspid aortic valve stenosis     Postoperative Diagnosis:  Same     Procedure:   Median Sternotomy Extracorporeal circulation 3.   Aortic valve replacement using a 23 mm Edwards INSPIRIS RESILIA pericardial valve.   History of Present Illness:    The patient is seen in the office on today's date in routine postsurgical follow-up status post the above described procedure.  He reports that he is doing very well overall.  He is no longer on any pain medication other than an occasional Tylenol.  He has had no difficulties with fevers, chills or other significant constitutional symptoms and has had no for difficulties related to his incisions.  He denies shortness of breath or palpitations.  He is not having any lower extremity edema.  He is making good progress with ambulation.  Overall he is quite pleased with his progress.      Past Medical History:  Diagnosis Date   Depression    Essential hypertension 09/19/2020   GERD (gastroesophageal reflux disease)    Hyperlipidemia    Hypertension    Migraine    Mixed hyperlipidemia 09/19/2020   Other chest pain 09/19/2020   Other fatigue 09/19/2020   Skin cancer of arm, right      Social History   Tobacco Use  Smoking Status Former   Types: Cigarettes  Smokeless Tobacco Never  Tobacco Comments   Vape pen with THC. Daily     Social History   Substance and Sexual Activity  Alcohol Use Never     Allergies  Allergen Reactions   Codeine Rash    Current Outpatient Medications  Medication Sig Dispense  Refill   acetaminophen (TYLENOL) 500 MG tablet Take 1-2 tablets (500-1,000 mg total) by mouth every 6 (six) hours. 30 tablet 0   amoxicillin (AMOXIL) 500 MG capsule Take four 500 mg capsules 30-60 mins prior to dental procedures. 4 capsule 3   aspirin EC 325 MG tablet Take 1 tablet (325 mg total) by mouth daily.     atenolol (TENORMIN) 50 MG tablet Take 1 tablet (50 mg total) by mouth daily. 90 tablet 3   fluticasone (FLONASE) 50 MCG/ACT nasal spray PLACE 2 SPRAYS INTO BOTH NOSTRILS DAILY AS NEEDED FOR ALLERGIES OR RHINITIS 48 g 0   furosemide (LASIX) 40 MG tablet Take 1 tablet (40 mg total) by mouth daily as needed. For weight gain of 3 lbs in 24 hours or 5 lbs in 1 week 30 tablet 1   losartan (COZAAR) 25 MG tablet Take 1 tablet (25 mg total) by mouth daily. 30 tablet 3   nitroGLYCERIN (NITROSTAT) 0.4 MG SL tablet Place 1 tablet (0.4 mg total) under the tongue every 5 (five) minutes as needed for chest pain. 25 tablet 3   omeprazole (PRILOSEC) 40 MG capsule Take 1 capsule by mouth once daily 90 capsule 0   PARoxetine (PAXIL) 10 MG tablet Take 1 tablet (10 mg total) by mouth daily. 90 tablet 1  potassium chloride SA (KLOR-CON M) 20 MEQ tablet Take 1 tablet (20 mEq total) by mouth daily as needed. Only take on days you take a Lasix 30 tablet 1   rosuvastatin (CRESTOR) 10 MG tablet Take 1 tablet by mouth once daily 90 tablet 0   traMADol (ULTRAM) 50 MG tablet Take 1-2 tablets (50-100 mg total) by mouth every 4 (four) hours as needed for moderate pain. 30 tablet 0   No current facility-administered medications for this visit.       Physical Exam: BP (!) 170/99   Pulse 78   Resp 20   Ht '5\' 7"'$  (1.702 m)   Wt 180 lb (81.6 kg)   SpO2 97% Comment: RA  BMI 28.19 kg/m   General appearance: alert, cooperative, and no distress Heart: regular rate and rhythm and soft systolic murmur Lungs: clear to auscultation bilaterally Abdomen: Benign exam Extremities: No edema Wound: Incision well-healed  without evidence of infection   Diagnostic Studies & Laboratory data:     Recent Radiology Findings:   DG Chest 2 View  Result Date: 07/16/2022 CLINICAL DATA:  Status post aortic valve replacement. EXAM: CHEST - 2 VIEW COMPARISON:  Chest x-ray dated June 19, 2022 FINDINGS: Normal cardiac and mediastinal contours. Status post median sternotomy and aortic valve replacement. Lungs are clear. No pleural effusion or pneumothorax. IMPRESSION: No active cardiopulmonary disease. Electronically Signed   By: Darren Lyons M.D.   On: 07/16/2022 13:33      Recent Lab Findings: Lab Results  Component Value Date   WBC 6.5 07/09/2022   HGB 12.0 (L) 07/09/2022   HCT 36.1 (L) 07/09/2022   PLT 308 07/09/2022   GLUCOSE 87 07/09/2022   CHOL 145 03/15/2022   TRIG 76 03/15/2022   HDL 42 03/15/2022   LDLCALC 88 03/15/2022   ALT 15 06/13/2022   AST 18 06/13/2022   NA 141 07/09/2022   K 4.7 07/09/2022   CL 103 07/09/2022   CREATININE 0.93 07/09/2022   BUN 22 07/09/2022   CO2 25 07/09/2022   TSH 2.860 09/03/2021   INR 0.9 06/17/2022   HGBA1C 5.4 06/13/2022      Assessment / Plan: Excellent ongoing progress in his postsurgical recovery.  He is noted to have elevated blood pressure on today's measurement we discussed keeping a record and informing his primary care or cardiology should blood pressure readings be consistently elevated.  He is to continue his current medication regimen.  I reviewed his chest x-ray and there are no significant cardiopulmonary findings.  We will see the patient again on a as needed basis for any surgically related needs or at request.      Medication Changes: No orders of the defined types were placed in this encounter.     Darren Giovanni, PA-C  07/16/2022 1:47 PM

## 2022-07-16 NOTE — Patient Instructions (Signed)
Discussed activity progression including driving and lifting restrictions

## 2022-07-30 ENCOUNTER — Ambulatory Visit (HOSPITAL_COMMUNITY): Payer: 59 | Attending: Cardiology

## 2022-07-30 DIAGNOSIS — Z952 Presence of prosthetic heart valve: Secondary | ICD-10-CM | POA: Diagnosis present

## 2022-07-30 LAB — ECHOCARDIOGRAM COMPLETE
AR max vel: 2.18 cm2
AV Area VTI: 2.05 cm2
AV Area mean vel: 2.01 cm2
AV Mean grad: 6 mmHg
AV Peak grad: 10.6 mmHg
Ao pk vel: 1.63 m/s
Area-P 1/2: 3.48 cm2
S' Lateral: 2.4 cm

## 2022-08-17 IMAGING — CT CTA CHEST W/ AND/OR W/O CM W/ OR W/O DISSECTION AND GATING
1 of 9 series · 2 of 16 positions shown, 3 images · IV contrast (APPLIED)
Comparison: Low-dose CT of the chest on 09/18/2021 at [facility]

CLINICAL DATA: Aortic valvular stenosis.

EXAM:
CT ANGIOGRAPHY CHEST WITH CONTRAST
TECHNIQUE: Multidetector CT imaging of the chest was performed using the
standard protocol during bolus administration of intravenous
contrast. Multiplanar CT image reconstructions and MIPs were
obtained to evaluate the vascular anatomy.

[Series 9: arterial thins · axial · arterial · 0.70mm/px · z∈[+1252,+1350]mm · 2 of 488 slices shown, 3 images]
[im 163/488  soft-tissue]
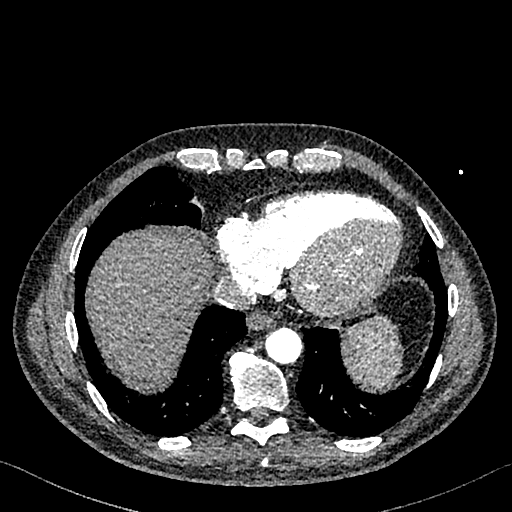
[im 163/488  bone]
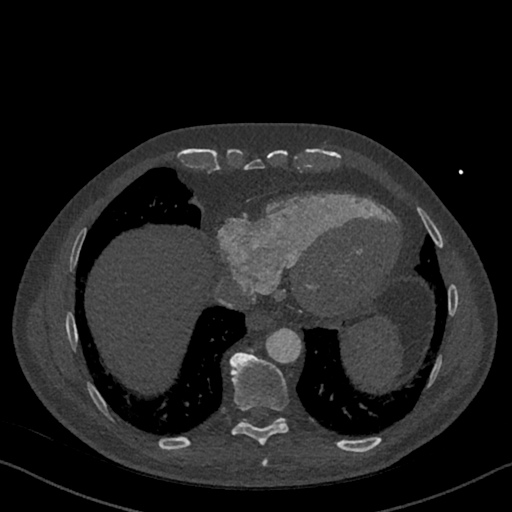
[im 325/488  soft-tissue]
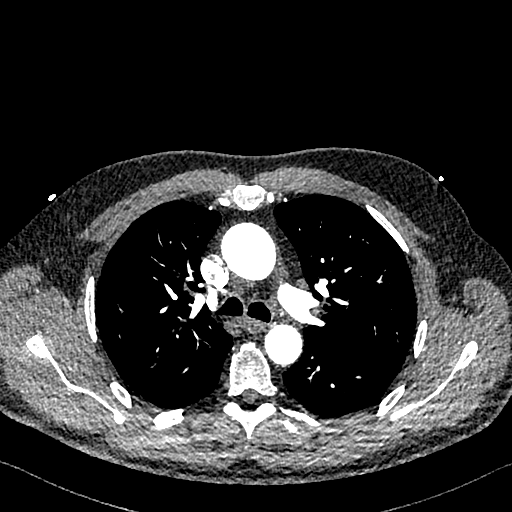

[2 of 16 positions shown; findings below may reference images not displayed]

RADIATION DOSE REDUCTION: This exam was performed according to the
departmental dose-optimization program which includes automated
exposure control, adjustment of the mA and/or kV according to
patient size and/or use of iterative reconstruction technique.

CONTRAST:  100mL OMNIPAQUE IOHEXOL 350 MG/ML SOLN
FINDINGS: Cardiovascular: The aortic valve is heavily calcified and also
appears thickened by CT. The aortic root is normal in caliber and
measures approximately 3.2 cm on axial images and up to 3.5 cm on
coronal reconstructions. The ascending thoracic aorta measures up to
3.8 cm. The proximal arch measures 3 cm and the distal arch 2.3 cm.
The descending thoracic aorta measures 2.5 cm. Minimal calcified
plaque is present at the level of the aortic arch and descending
thoracic aorta. Visualized proximal great vessels demonstrate normal
patency and branching anatomy. No evidence of aortic dissection.

The heart size is at the upper limits of normal. There may be some
degree of underlying left ventricular hypertrophy. No pericardial
fluid identified. Scattered calcified plaque is present in the
coronary arteries.

Mediastinum/Nodes: No enlarged mediastinal, hilar, or axillary lymph
nodes. Thyroid gland, trachea, and esophagus demonstrate no
significant findings.

Lungs/Pleura: There is no evidence of pulmonary edema,
consolidation, pneumothorax, nodule or pleural fluid.

Upper Abdomen: Well-circumscribed benign-appearing cyst in the
anterior left lobe of the liver measures up to 2.2 cm in maximum
diameter.

Musculoskeletal: No chest wall abnormality. No acute or significant
osseous findings.

Review of the MIP images confirms the above findings.
IMPRESSION: 1. Heavily calcified and thickened aortic valve.
2. No associated overt aneurysmal dilatation of the thoracic aorta
with the ascending thoracic aorta measuring up to 3.8 cm in
estimated maximal diameter. Mild thoracic aortic atherosclerosis.
3. Top-normal heart size with suggestion of underlying left
ventricular hypertrophy.
4. Coronary atherosclerosis.
5. Benign-appearing incidental hepatic cyst.

Aortic Atherosclerosis ([id number]).

## 2022-08-20 IMAGING — DX DG CHEST 2V
2 series · 2 of 2 positions shown · non-contrast
Comparison: Chest radiograph 08/16/2008

CLINICAL DATA: Preoperative evaluation.

EXAM:
CHEST - 2 VIEW

[w chest pa]
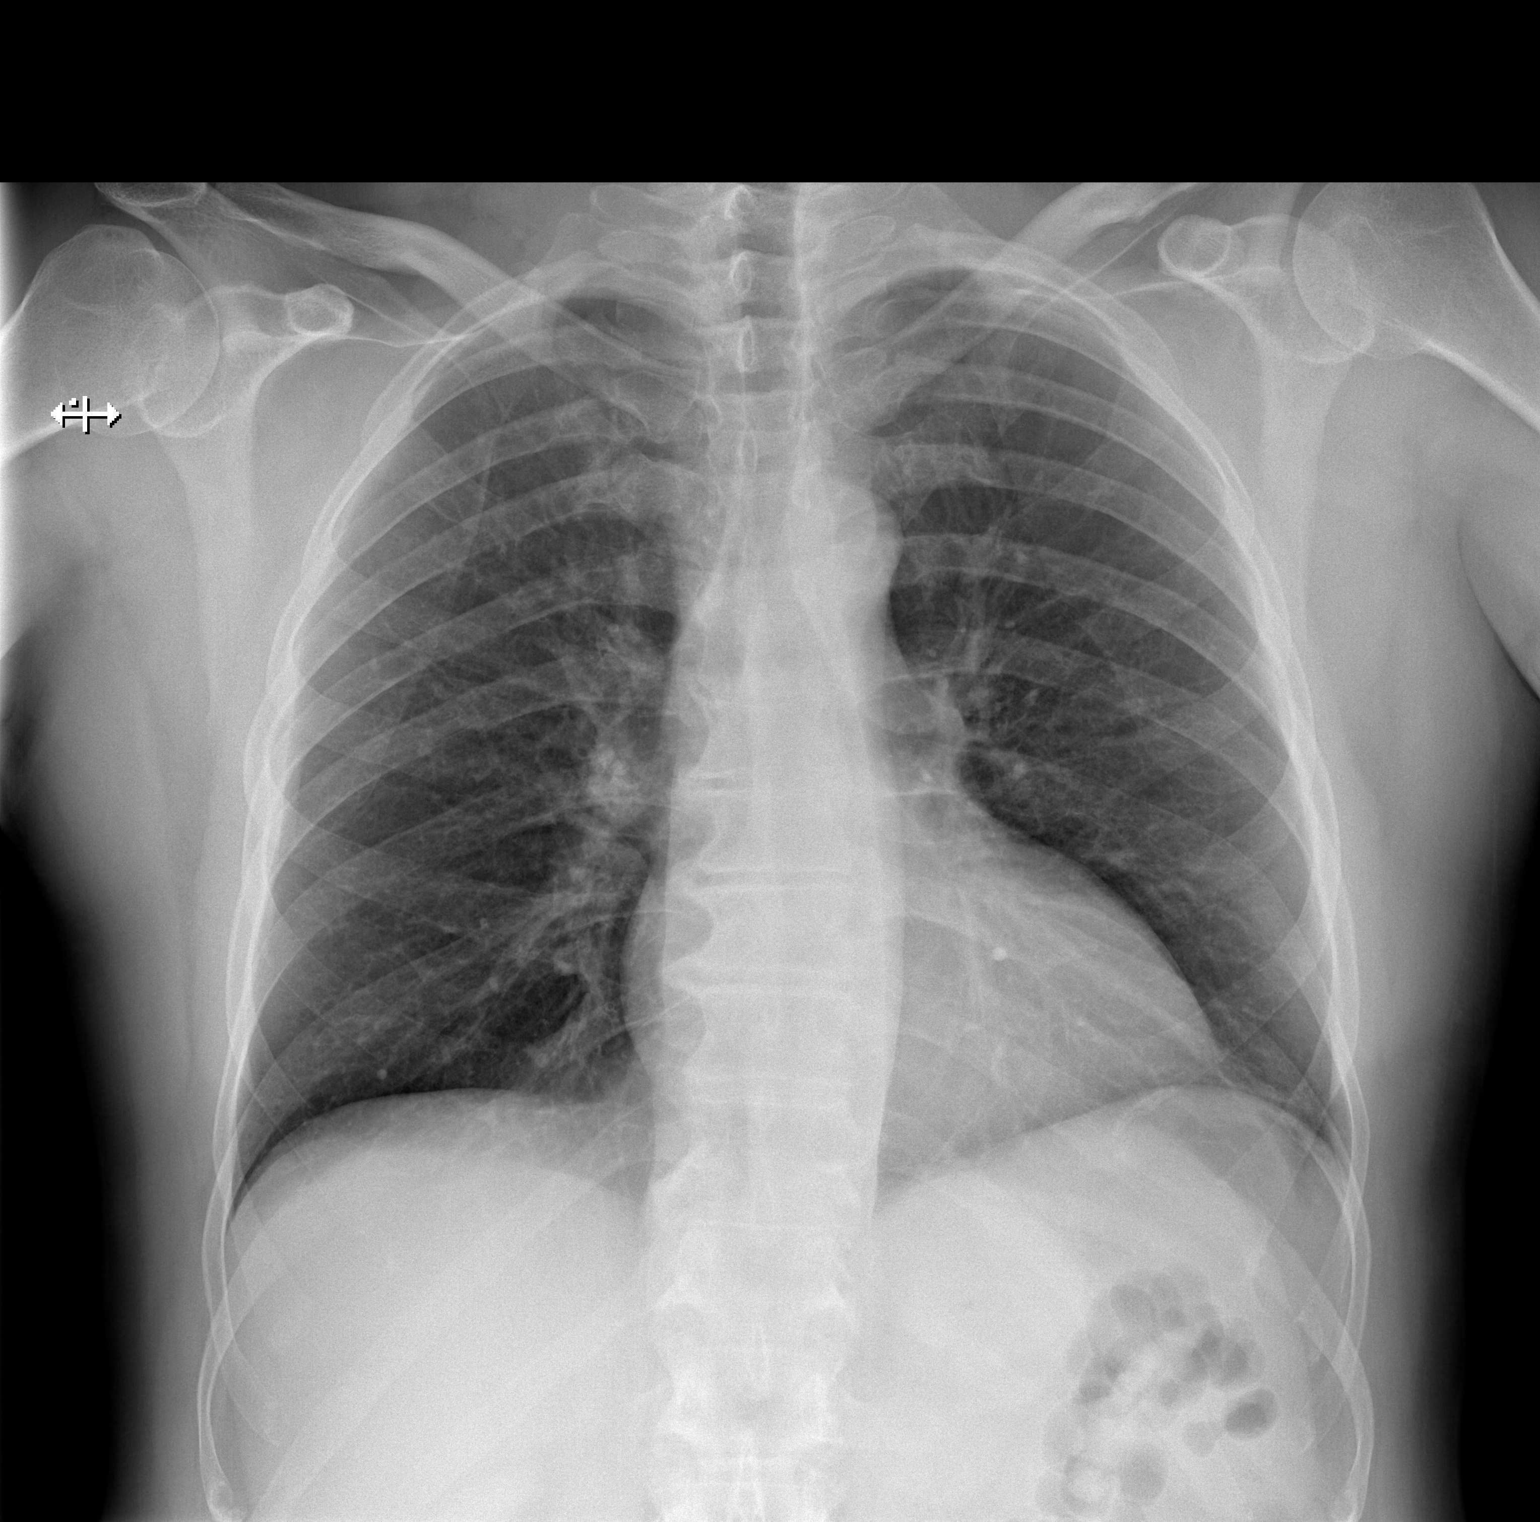

[w chest lat]
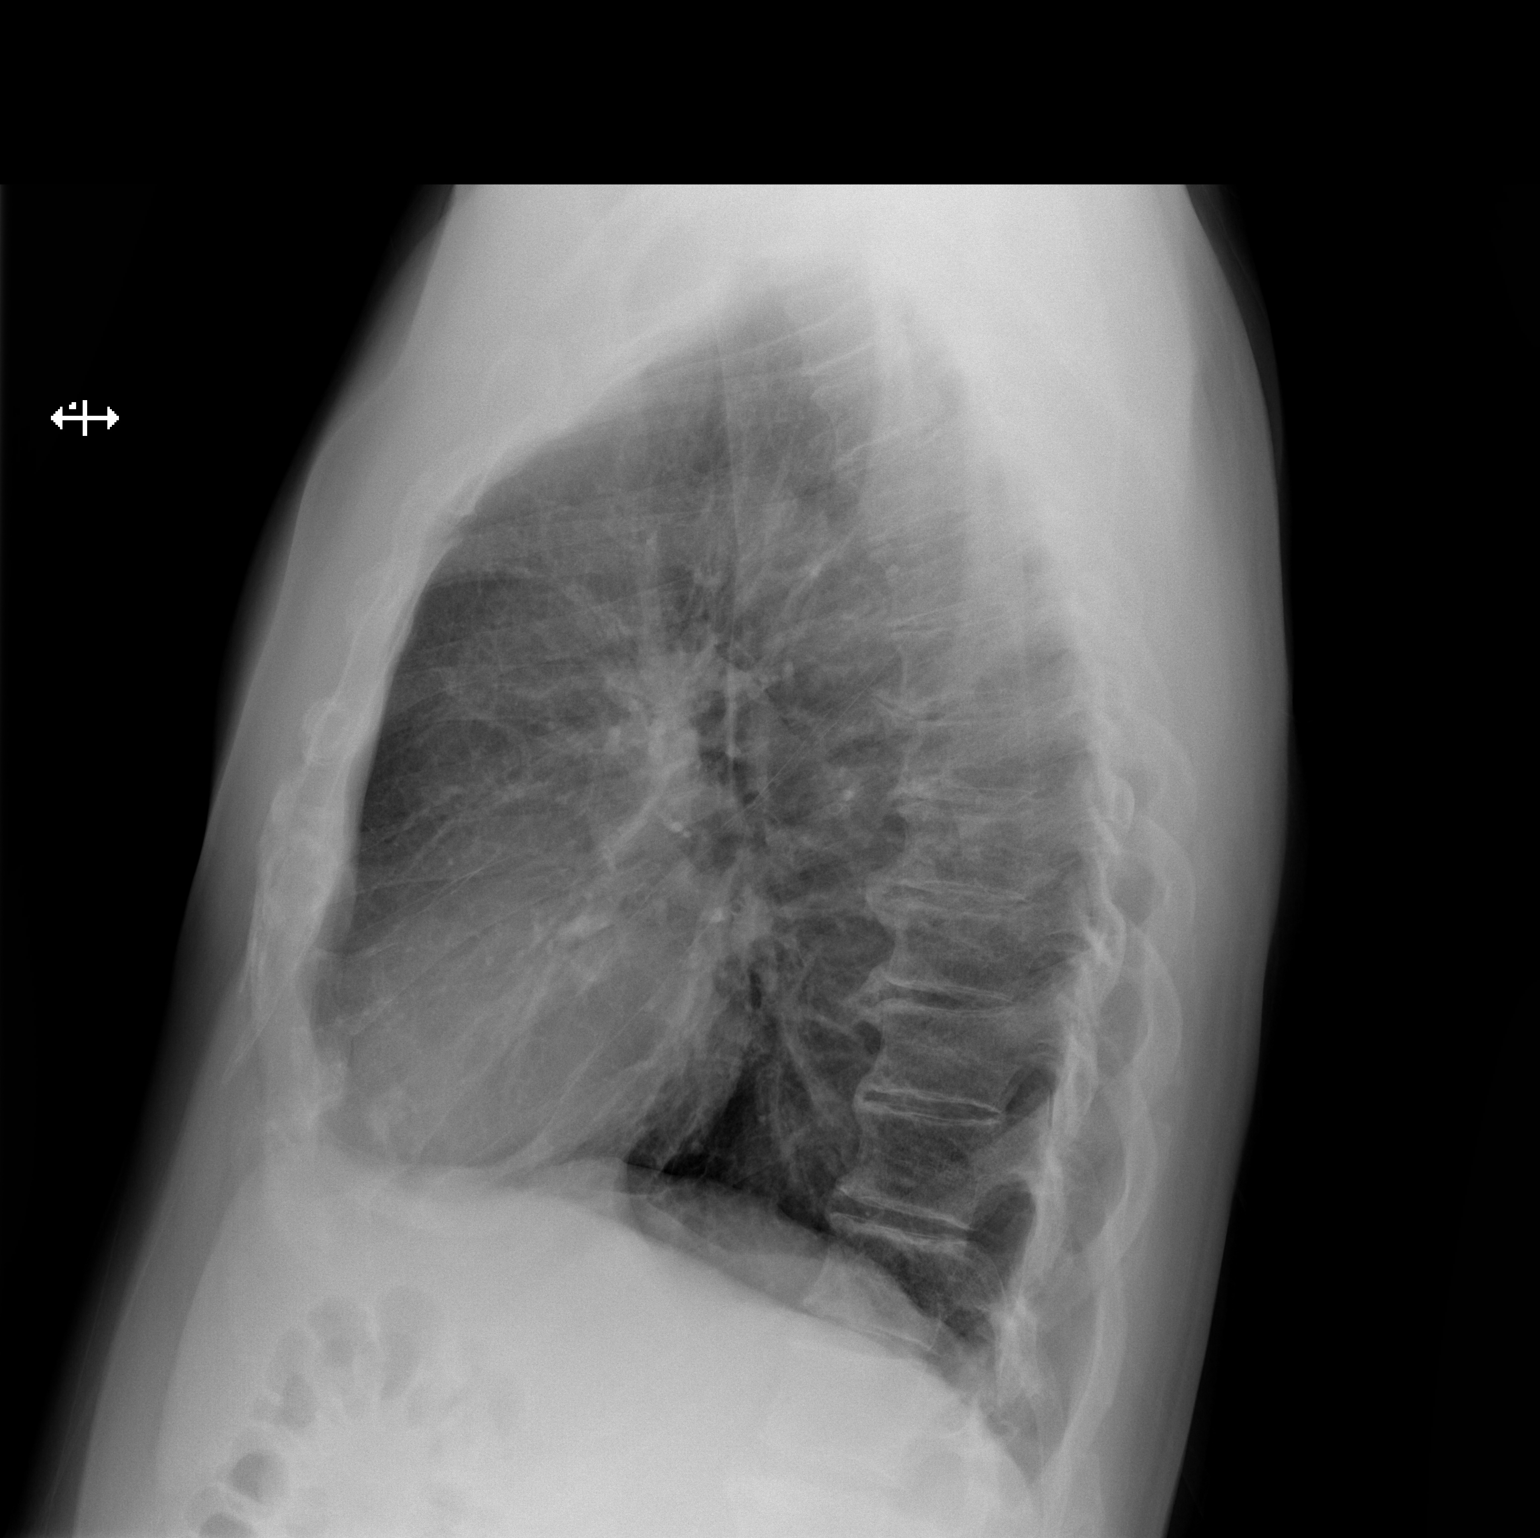

[2 of 2 positions shown; findings below may reference images not displayed]

FINDINGS: The heart size and mediastinal contours are within normal limits.
Both lungs are clear. The visualized skeletal structures are
unremarkable.
IMPRESSION: No active cardiopulmonary disease.

## 2022-09-12 ENCOUNTER — Encounter: Payer: Self-pay | Admitting: Physician Assistant

## 2022-09-12 ENCOUNTER — Ambulatory Visit: Payer: 59 | Admitting: Physician Assistant

## 2022-09-12 VITALS — BP 130/80 | HR 68 | Temp 97.7°F | Wt 187.0 lb

## 2022-09-12 DIAGNOSIS — F320 Major depressive disorder, single episode, mild: Secondary | ICD-10-CM | POA: Diagnosis not present

## 2022-09-12 DIAGNOSIS — E782 Mixed hyperlipidemia: Secondary | ICD-10-CM

## 2022-09-12 DIAGNOSIS — Z125 Encounter for screening for malignant neoplasm of prostate: Secondary | ICD-10-CM

## 2022-09-12 DIAGNOSIS — Z122 Encounter for screening for malignant neoplasm of respiratory organs: Secondary | ICD-10-CM

## 2022-09-12 DIAGNOSIS — I10 Essential (primary) hypertension: Secondary | ICD-10-CM | POA: Diagnosis not present

## 2022-09-12 DIAGNOSIS — I350 Nonrheumatic aortic (valve) stenosis: Secondary | ICD-10-CM | POA: Diagnosis not present

## 2022-09-12 DIAGNOSIS — K219 Gastro-esophageal reflux disease without esophagitis: Secondary | ICD-10-CM

## 2022-09-12 NOTE — Progress Notes (Signed)
Subjective:  Patient ID: Darren Lyons, male    DOB: 1966/07/24  Age: 56 y.o. MRN: [id number]  Chief Complaint  Patient presents with   Hypertension   Hyperlipidemia    HPI  Pt presents for follow up of hypertension. The patient is tolerating the medication well without side effects. Compliance with treatment has been good; including taking medication as directed , maintains a healthy diet and regular exercise regimen , and following up as directed. Currently pt is unsure of how he is taking his medications - will have wife call back and verify  Mixed hyperlipidemia  Pt presents with hyperlipidemia. Compliance with treatment has been good The patient is compliant with medications, maintains a low cholesterol diet , follows up as directed , and maintains an exercise regimen . The patient denies experiencing any hypercholesterolemia related symptoms.  Pt is taking crestor '10mg'$  qd  Pt with history of depression - symptoms stable on paxil '10mg'$  qd  Pt did have his aortic valve replaced by Dr Cyndia Bent on 06/18/22 - he has been released by his practice and will follow up with his cardiologist Dr Claiborne Billings in the next few weeks Pt will be returning to work next week   Pt with history of tobacco abuse - will be due for repeat low dose chest CT screening at end of this month  Current Outpatient Medications on File Prior to Visit  Medication Sig Dispense Refill   acetaminophen (TYLENOL) 500 MG tablet Take 1-2 tablets (500-1,000 mg total) by mouth every 6 (six) hours. 30 tablet 0   aspirin EC 325 MG tablet Take 1 tablet (325 mg total) by mouth daily.     atenolol (TENORMIN) 50 MG tablet Take 1 tablet (50 mg total) by mouth daily. 90 tablet 3   fluticasone (FLONASE) 50 MCG/ACT nasal spray PLACE 2 SPRAYS INTO BOTH NOSTRILS DAILY AS NEEDED FOR ALLERGIES OR RHINITIS 48 g 0   furosemide (LASIX) 40 MG tablet Take 1 tablet (40 mg total) by mouth daily as needed. For weight gain of 3 lbs in 24 hours or 5 lbs in 1  week 30 tablet 1   losartan (COZAAR) 25 MG tablet Take 1 tablet (25 mg total) by mouth daily. 30 tablet 3   nitroGLYCERIN (NITROSTAT) 0.4 MG SL tablet Place 1 tablet (0.4 mg total) under the tongue every 5 (five) minutes as needed for chest pain. 25 tablet 3   omeprazole (PRILOSEC) 40 MG capsule Take 1 capsule by mouth once daily 90 capsule 0   PARoxetine (PAXIL) 10 MG tablet Take 1 tablet (10 mg total) by mouth daily. 90 tablet 1   potassium chloride SA (KLOR-CON M) 20 MEQ tablet Take 1 tablet (20 mEq total) by mouth daily as needed. Only take on days you take a Lasix 30 tablet 1   rosuvastatin (CRESTOR) 10 MG tablet Take 1 tablet by mouth once daily 90 tablet 0   traMADol (ULTRAM) 50 MG tablet Take 1-2 tablets (50-100 mg total) by mouth every 4 (four) hours as needed for moderate pain. 30 tablet 0   amoxicillin (AMOXIL) 500 MG capsule Take four 500 mg capsules 30-60 mins prior to dental procedures. (Patient not taking: Reported on 09/12/2022) 4 capsule 3   No current facility-administered medications on file prior to visit.   Past Medical History:  Diagnosis Date   Depression    Essential hypertension 09/19/2020   GERD (gastroesophageal reflux disease)    Hyperlipidemia    Hypertension    Migraine  Mixed hyperlipidemia 09/19/2020   Other chest pain 09/19/2020   Other fatigue 09/19/2020   Skin cancer of arm, right    Past Surgical History:  Procedure Laterality Date   AORTIC VALVE REPLACEMENT N/A 06/17/2022   Procedure: AORTIC VALVE REPLACEMENT USING AN INSPIRIS 23MM VALVE;  Surgeon: Gaye Pollack, MD;  Location: [place];  Service: Open Heart Surgery;  Laterality: N/A;   APPENDECTOMY     LEFT HEART CATH AND CORONARY ANGIOGRAPHY N/A 10/02/2020   Procedure: LEFT HEART CATH AND CORONARY ANGIOGRAPHY;  Surgeon: Troy Sine, MD;  Location: [place] [facility];  Service: Cardiovascular;  Laterality: N/A;   RIGHT HEART CATH N/A 10/02/2020   Procedure: RIGHT HEART CATH;  Surgeon: Troy Sine, MD;  Location: [place] [facility];  Service: Cardiovascular;  Laterality: N/A;   RIGHT HEART CATH AND CORONARY ANGIOGRAPHY N/A 05/07/2022   Procedure: RIGHT HEART CATH AND CORONARY ANGIOGRAPHY;  Surgeon: Troy Sine, MD;  Location: [place] [facility];  Service: Cardiovascular;  Laterality: N/A;   TEE WITHOUT CARDIOVERSION N/A 06/17/2022   Procedure: TRANSESOPHAGEAL ECHOCARDIOGRAM (TEE);  Surgeon: Gaye Pollack, MD;  Location: [place];  Service: Open Heart Surgery;  Laterality: N/A;    Family History  Problem Relation Age of Onset   Diabetes Mother    Hypertension Mother    Cancer Father    Social History   Socioeconomic History   Marital status: Married    Spouse name: Not on file   Number of children: Not on file   Years of education: Not on file   Highest education level: Not on file  Occupational History   Not on file  Tobacco Use   Smoking status: Former    Types: Cigarettes   Smokeless tobacco: Never   Tobacco comments:    Vape pen with THC. Daily   Vaping Use   Vaping Use: Every day  Substance and Sexual Activity   Alcohol use: Never   Drug use: Yes    Types: Marijuana    Comment: Vape THC   Sexual activity: Not on file  Other Topics Concern   Not on file  Social History Narrative   Not on file   Social Determinants of Health   Financial Resource Strain: Not on file  Food Insecurity: Not on file  Transportation Needs: Not on file  Physical Activity: Not on file  Stress: Not on file  Social Connections: Not on file    Review of Systems CONSTITUTIONAL: Negative for chills, fatigue, fever, unintentional weight gain and unintentional weight loss.  E/N/T: Negative for ear pain, nasal congestion and sore throat.  CARDIOVASCULAR: Negative for chest pain, dizziness, palpitations and pedal edema.  RESPIRATORY: Negative for recent cough and dyspnea.  GASTROINTESTINAL: Negative for abdominal pain, acid reflux symptoms, constipation, diarrhea, nausea and  vomiting.  MSK: Negative for arthralgias and myalgias.  INTEGUMENTARY: Negative for rash.  NEUROLOGICAL: Negative for dizziness and headaches.  PSYCHIATRIC: Negative for sleep disturbance and to question depression screen.  Negative for depression, negative for anhedonia.       Objective:  PHYSICAL EXAM:   VS: BP 130/80   Pulse 68   Temp 97.7 F (36.5 C)   Wt 187 lb (84.8 kg)   SpO2 98%   BMI 29.29 kg/m   GEN: Well nourished, well developed, in no acute distress  Cardiac: RRR; no murmurs, rubs, or gallops,no edema -  Respiratory:  normal respiratory rate and pattern with no distress - normal breath  sounds with no rales, rhonchi, wheezes or rubs MS: no deformity or atrophy  Skin: warm and dry, no rash  Neuro:  Alert and Oriented x 3, Strength and sensation are intact - CN II-Xii grossly intact Psych: euthymic mood, appropriate affect and demeanor   Lab Results  Component Value Date   WBC 6.5 07/09/2022   HGB 12.0 (L) 07/09/2022   HCT 36.1 (L) 07/09/2022   PLT 308 07/09/2022   GLUCOSE 87 07/09/2022   CHOL 145 03/15/2022   TRIG 76 03/15/2022   HDL 42 03/15/2022   LDLCALC 88 03/15/2022   ALT 15 06/13/2022   AST 18 06/13/2022   NA 141 07/09/2022   K 4.7 07/09/2022   CL 103 07/09/2022   CREATININE 0.93 07/09/2022   BUN 22 07/09/2022   CO2 25 07/09/2022   TSH 2.860 09/03/2021   INR 0.9 06/17/2022   HGBA1C 5.4 06/13/2022      Assessment & Plan:   Problem List Items Addressed This Visit       Cardiovascular and Mediastinum   Essential hypertension - Primary Continue current meds   Nonrheumatic aortic valve stenosis Follow up with cardiology as directed     Digestive   Gastroesophageal reflux disease without esophagitis Continue meds     Other   Mixed hyperlipidemia Continue meds and watch diet   Depression, major, single episode, mild ([place]) Continue meds   Screening for lung cancer   Relevant Orders   CT CHEST LUNG CA SCREEN LOW DOSE W/O CM   .  No orders of the defined types were placed in this encounter.   Orders Placed This Encounter  Procedures   CT CHEST LUNG CA SCREEN LOW DOSE W/O CM   CBC with Differential/Platelet   Comprehensive metabolic panel   TSH   Lipid panel   PSA     Follow-up: Return in about 6 months (around 03/13/2023) for chronic fasting follow up.  An After Visit Summary was printed and given to the patient.  Yetta Flock [facility] [phone]

## 2022-09-12 NOTE — Progress Notes (Signed)
Return to work note per patient's request.

## 2022-09-13 LAB — CBC WITH DIFFERENTIAL/PLATELET
Basophils Absolute: 0.1 10*3/uL (ref 0.0–0.2)
Basos: 1 %
EOS (ABSOLUTE): 0.2 10*3/uL (ref 0.0–0.4)
Eos: 3 %
Hematocrit: 44.9 % (ref 37.5–51.0)
Hemoglobin: 15.3 g/dL (ref 13.0–17.7)
Immature Grans (Abs): 0 10*3/uL (ref 0.0–0.1)
Immature Granulocytes: 0 %
Lymphocytes Absolute: 3 10*3/uL (ref 0.7–3.1)
Lymphs: 38 %
MCH: 28.2 pg (ref 26.6–33.0)
MCHC: 34.1 g/dL (ref 31.5–35.7)
MCV: 83 fL (ref 79–97)
Monocytes Absolute: 0.4 10*3/uL (ref 0.1–0.9)
Monocytes: 6 %
Neutrophils Absolute: 4 10*3/uL (ref 1.4–7.0)
Neutrophils: 52 %
Platelets: 209 10*3/uL (ref 150–450)
RBC: 5.42 x10E6/uL (ref 4.14–5.80)
RDW: 13.6 % (ref 11.6–15.4)
WBC: 7.7 10*3/uL (ref 3.4–10.8)

## 2022-09-13 LAB — LIPID PANEL
Chol/HDL Ratio: 3.7 ratio (ref 0.0–5.0)
Cholesterol, Total: 169 mg/dL (ref 100–199)
HDL: 46 mg/dL (ref >39)
LDL Chol Calc (NIH): 96 mg/dL (ref 0–99)
Triglycerides: 152 mg/dL — ABNORMAL HIGH (ref 0–149)
VLDL Cholesterol Cal: 27 mg/dL (ref 5–40)

## 2022-09-13 LAB — COMPREHENSIVE METABOLIC PANEL
ALT: 17 IU/L (ref 0–44)
AST: 20 IU/L (ref 0–40)
Albumin/Globulin Ratio: 2.3 — ABNORMAL HIGH (ref 1.2–2.2)
Albumin: 5 g/dL — ABNORMAL HIGH (ref 3.8–4.9)
Alkaline Phosphatase: 90 IU/L (ref 44–121)
BUN/Creatinine Ratio: 13 (ref 9–20)
BUN: 15 mg/dL (ref 6–24)
Bilirubin Total: 0.6 mg/dL (ref 0.0–1.2)
CO2: 24 mmol/L (ref 20–29)
Calcium: 9.7 mg/dL (ref 8.7–10.2)
Chloride: 100 mmol/L (ref 96–106)
Creatinine, Ser: 1.15 mg/dL (ref 0.76–1.27)
Globulin, Total: 2.2 g/dL (ref 1.5–4.5)
Glucose: 95 mg/dL (ref 70–99)
Potassium: 5 mmol/L (ref 3.5–5.2)
Sodium: 142 mmol/L (ref 134–144)
Total Protein: 7.2 g/dL (ref 6.0–8.5)
eGFR: 74 mL/min/{1.73_m2} (ref >59)

## 2022-09-13 LAB — CARDIOVASCULAR RISK ASSESSMENT

## 2022-09-13 LAB — TSH: TSH: 2.35 u[IU]/mL (ref 0.450–4.500)

## 2022-09-13 LAB — PSA: Prostate Specific Ag, Serum: 0.4 ng/mL (ref 0.0–4.0)

## 2022-09-19 ENCOUNTER — Ambulatory Visit: Payer: 59 | Admitting: Physician Assistant

## 2022-09-26 ENCOUNTER — Other Ambulatory Visit: Payer: Self-pay

## 2022-09-26 ENCOUNTER — Telehealth: Payer: Self-pay

## 2022-09-26 ENCOUNTER — Other Ambulatory Visit: Payer: Self-pay | Admitting: Physician Assistant

## 2022-09-26 DIAGNOSIS — Z122 Encounter for screening for malignant neoplasm of respiratory organs: Secondary | ICD-10-CM

## 2022-09-26 DIAGNOSIS — J438 Other emphysema: Secondary | ICD-10-CM

## 2022-09-26 MED ORDER — BUDESONIDE-FORMOTEROL FUMARATE 160-4.5 MCG/ACT IN AERO: 2.0000 | INHALATION_SPRAY | Freq: Two times a day (BID) | RESPIRATORY_TRACT | 3 refills | Status: DC

## 2022-09-26 NOTE — Telephone Encounter (Signed)
Patient made aware of CT Lung Screening Results. Patient stated he does agree to take maintenance inhaler for COPD.

## 2022-09-26 NOTE — Telephone Encounter (Signed)
Rx for symbicort sent in to take as directed

## 2022-10-05 ENCOUNTER — Other Ambulatory Visit: Payer: Self-pay | Admitting: Physician Assistant

## 2022-10-05 DIAGNOSIS — F320 Major depressive disorder, single episode, mild: Secondary | ICD-10-CM

## 2022-10-15 ENCOUNTER — Encounter: Payer: Self-pay | Admitting: Nurse Practitioner

## 2022-10-15 ENCOUNTER — Ambulatory Visit: Payer: 59 | Attending: Nurse Practitioner | Admitting: Nurse Practitioner

## 2022-10-15 VITALS — BP 142/98 | HR 62 | Ht 67.0 in | Wt 191.0 lb

## 2022-10-15 DIAGNOSIS — I2510 Atherosclerotic heart disease of native coronary artery without angina pectoris: Secondary | ICD-10-CM

## 2022-10-15 DIAGNOSIS — I350 Nonrheumatic aortic (valve) stenosis: Secondary | ICD-10-CM

## 2022-10-15 DIAGNOSIS — I10 Essential (primary) hypertension: Secondary | ICD-10-CM | POA: Diagnosis not present

## 2022-10-15 DIAGNOSIS — I77810 Thoracic aortic ectasia: Secondary | ICD-10-CM

## 2022-10-15 DIAGNOSIS — Z952 Presence of prosthetic heart valve: Secondary | ICD-10-CM

## 2022-10-15 DIAGNOSIS — E785 Hyperlipidemia, unspecified: Secondary | ICD-10-CM

## 2022-10-15 MED ORDER — ROSUVASTATIN CALCIUM 20 MG PO TABS: 20.0000 mg | ORAL_TABLET | Freq: Every day | ORAL | 3 refills | Status: DC

## 2022-10-15 MED ORDER — LOSARTAN POTASSIUM 50 MG PO TABS: 50.0000 mg | ORAL_TABLET | Freq: Every day | ORAL | 3 refills | Status: DC

## 2022-10-15 NOTE — Patient Instructions (Addendum)
Medication Instructions:  Increase Crestor 20 mg daily Increase Losartan 50 mg daily  *If you need a refill on your cardiac medications before your next appointment, please call your pharmacy*   Lab Work: NONE ordered at this time of appointment   If you have labs (blood work) drawn today and your tests are completely normal, you will receive your results only by: [facility] (if you have MyChart) OR A paper copy in the mail If you have any lab test that is abnormal or we need to change your treatment, we will call you to review the results.   Testing/Procedures: NONE ordered at this time of appointment     Follow-Up: At [facility], you and your health needs are our priority.  As part of our continuing mission to provide you with exceptional heart care, we have created designated Provider Care Teams.  These Care Teams include your primary Cardiologist (physician) and Advanced Practice Providers (APPs -  Physician Assistants and Nurse Practitioners) who all work together to provide you with the care you need, when you need it.  We recommend signing up for the patient portal called "MyChart".  Sign up information is provided on this After Visit Summary.  MyChart is used to connect with patients for Virtual Visits (Telemedicine).  Patients are able to view lab/test results, encounter notes, upcoming appointments, etc.  Non-urgent messages can be sent to your provider as well.   To learn more about what you can do with MyChart, go to [email or web address].    Your next appointment:   6 week(s)  The format for your next appointment:   In Person  Provider:   Diona Browner, NP        Other Instructions Please come back to your next appointment fasting. Monitor blood pressure. Goal is 130/80.   Important Information About Sugar

## 2022-10-15 NOTE — Progress Notes (Signed)
Office Visit    Patient Name: Darren Lyons Date of Encounter: 10/15/2022  Primary Care Provider:  Marge Duncans, PA-C Primary Cardiologist:  Shelva Majestic, MD  Chief Complaint    56 year old male with a history of severe aortic valve stenosis s/p aortic valve replacement, CAD, hypertension, hyperlipidemia GERD, and depression who presents for follow-up related to aortic stenosis.  Past Medical History    Past Medical History:  Diagnosis Date   Depression    Essential hypertension 09/19/2020   GERD (gastroesophageal reflux disease)    Hyperlipidemia    Hypertension    Migraine    Mixed hyperlipidemia 09/19/2020   Other chest pain 09/19/2020   Other fatigue 09/19/2020   Skin cancer of arm, right    Past Surgical History:  Procedure Laterality Date   AORTIC VALVE REPLACEMENT N/A 06/17/2022   Procedure: AORTIC VALVE REPLACEMENT USING AN INSPIRIS 23MM VALVE;  Surgeon: Gaye Pollack, MD;  Location: [place];  Service: Open Heart Surgery;  Laterality: N/A;   APPENDECTOMY     LEFT HEART CATH AND CORONARY ANGIOGRAPHY N/A 10/02/2020   Procedure: LEFT HEART CATH AND CORONARY ANGIOGRAPHY;  Surgeon: Troy Sine, MD;  Location: [place] [facility];  Service: Cardiovascular;  Laterality: N/A;   RIGHT HEART CATH N/A 10/02/2020   Procedure: RIGHT HEART CATH;  Surgeon: Troy Sine, MD;  Location: [place] [facility];  Service: Cardiovascular;  Laterality: N/A;   RIGHT HEART CATH AND CORONARY ANGIOGRAPHY N/A 05/07/2022   Procedure: RIGHT HEART CATH AND CORONARY ANGIOGRAPHY;  Surgeon: Troy Sine, MD;  Location: [place] [facility];  Service: Cardiovascular;  Laterality: N/A;   TEE WITHOUT CARDIOVERSION N/A 06/17/2022   Procedure: TRANSESOPHAGEAL ECHOCARDIOGRAM (TEE);  Surgeon: Gaye Pollack, MD;  Location: [place];  Service: Open Heart Surgery;  Laterality: N/A;    Allergies  Allergies  Allergen Reactions   Codeine Rash    History of Present Illness    56 year old male with the  above past medical history of severe aortic valve stenosis s/p valve replacement, CAD, hypertension, hyperlipidemia GERD, and depression.   He was previously evaluated by Dr. Geraldo Pitter in October 2021.  He was referred to Dr. Claiborne Billings for diagnostic cardiac catheterization in the setting of episodic chest pain which revealed aortic valve stenosis.  Right heart pressures were upper normal and his aortic valve peak to peak gradient was 43 mmHg with mean gradient of 42 mmHg.  He was found to have mild nonobstructive CAD with 20% stenosis in the LAD, circumflex marginal, and RCA.  Echocardiogram in October 2022 showed EF 60 to 65%, mild LVH, G2 DD, mild biatrial enlargement, suggestion of bicuspid aortic valve with moderate to severe stenosis with a mean gradient of 8.6 and peak gradient of 65.8, mild dilation of ascending aorta 39 mm.  He also underwent amyloid study which was equivocal.  Repeat echocardiogram in April 2023 showed normal LV function, EF 65 to 70%, moderate LVH, significant calcification of the aortic valve with mean gradient 42 mmHg, aortic valve peak gradient 75.7 mmHg. He was last seen in the office on 05/01/2022 and noted exertional dyspnea status of daytime sleepiness.  He underwent repeat R/LHC on 05/07/2022 which revealed mild nonobstructive CAD with 20% proximal LAD and mid RCA stenoses, focal 70% stenosis in the OM (small caliber vessel), mildly increased right heart pressures with PA pressure 38/13, mean 20 mmHg, heavily calcified aortic valve with reduced excursion, mean gradient 42 mmHg, with peak instantaneous gradient 75.7 mmHg.  He was referred to CT surgery and underwent aortic valve replacement with an Inspiris Resilia Biprosthetic 23 mm valve on 06/17/2022. Presurgery carotid dopplers revealed 1 to 39% B ICA stenosis.  He did have postop hypertension and was started on losartan, additionally, atenolol was increased.  Postop echocardiogram in 07/2022 showed EF 60 to 65%, normally functioning  AVR, mild dilation of ascending aorta, 41 mm.  He was last seen in the office on 07/09/2022 and was stable from a cardiac standpoint.   He presents today for follow-up made by his wife.  Since his discharge from the hospital he has been stable from a cardiac standpoint.  He denies any chest pain, dyspnea, edema, PND, orthopnea, weight gain, dizziness, palpitations, presyncope, or syncope.  His BP has been slightly elevated at home. BP is elevated in the office today.  He reports that he is no longer smoking but continues to vape.  He is he had a CT of his chest through his PCP which showed some early COPD.  He was started on Symbicort but thinks he will be unable to afford this.  Other than his elevated BP, he reports feeling well and denies any additional concerns today.  Home Medications    Current Outpatient Medications  Medication Sig Dispense Refill   acetaminophen (TYLENOL) 500 MG tablet Take 1-2 tablets (500-1,000 mg total) by mouth every 6 (six) hours. 30 tablet 0   amoxicillin (AMOXIL) 500 MG capsule Take four 500 mg capsules 30-60 mins prior to dental procedures. 4 capsule 3   aspirin EC 325 MG tablet Take 1 tablet (325 mg total) by mouth daily.     atenolol (TENORMIN) 50 MG tablet Take 1 tablet (50 mg total) by mouth daily. 90 tablet 3   fluticasone (FLONASE) 50 MCG/ACT nasal spray PLACE 2 SPRAYS INTO BOTH NOSTRILS DAILY AS NEEDED FOR ALLERGIES OR RHINITIS 48 g 0   furosemide (LASIX) 40 MG tablet Take 1 tablet (40 mg total) by mouth daily as needed. For weight gain of 3 lbs in 24 hours or 5 lbs in 1 week 30 tablet 1   losartan (COZAAR) 50 MG tablet Take 1 tablet (50 mg total) by mouth daily. 90 tablet 3   nitroGLYCERIN (NITROSTAT) 0.4 MG SL tablet Place 1 tablet (0.4 mg total) under the tongue every 5 (five) minutes as needed for chest pain. 25 tablet 3   omeprazole (PRILOSEC) 40 MG capsule Take 1 capsule by mouth once daily 90 capsule 0   PARoxetine (PAXIL) 10 MG tablet Take 1 tablet by  mouth once daily 90 tablet 0   potassium chloride SA (KLOR-CON M) 20 MEQ tablet Take 1 tablet (20 mEq total) by mouth daily as needed. Only take on days you take a Lasix 30 tablet 1   rosuvastatin (CRESTOR) 20 MG tablet Take 1 tablet (20 mg total) by mouth daily. 90 tablet 3   traMADol (ULTRAM) 50 MG tablet Take 1-2 tablets (50-100 mg total) by mouth every 4 (four) hours as needed for moderate pain. 30 tablet 0   No current facility-administered medications for this visit.     Review of Systems    He denies chest pain, palpitations, dyspnea, pnd, orthopnea, n, v, dizziness, syncope, edema, weight gain, or early satiety. All other systems reviewed and are otherwise negative except as noted above.   Physical Exam    VS:  BP (!) 142/98   Pulse 62   Ht '5\' 7"'$  (1.702 m)   Wt 191 lb (86.6 kg)  SpO2 99%   BMI 29.91 kg/m  GEN: Well nourished, well developed, in no acute distress. HEENT: normal. Neck: Supple, no JVD, carotid bruits, or masses. Cardiac: RRR, 2/6 murmur, rubs, or gallops. No clubbing, cyanosis, edema.  Radials/DP/PT 2+ and equal bilaterally.  Respiratory:  Respirations regular and unlabored, clear to auscultation bilaterally. GI: Soft, nontender, nondistended, BS + x 4. MS: no deformity or atrophy. Skin: warm and dry, no rash. Neuro:  Strength and sensation are intact. Psych: Normal affect.  Accessory Clinical Findings    ECG personally reviewed by me today -no EKG in office today.   Lab Results  Component Value Date   WBC 7.7 09/12/2022   HGB 15.3 09/12/2022   HCT 44.9 09/12/2022   MCV 83 09/12/2022   PLT 209 09/12/2022   Lab Results  Component Value Date   CREATININE 1.15 09/12/2022   BUN 15 09/12/2022   NA 142 09/12/2022   K 5.0 09/12/2022   CL 100 09/12/2022   CO2 24 09/12/2022   Lab Results  Component Value Date   ALT 17 09/12/2022   AST 20 09/12/2022   ALKPHOS 90 09/12/2022   BILITOT 0.6 09/12/2022   Lab Results  Component Value Date   CHOL  169 09/12/2022   HDL 46 09/12/2022   LDLCALC 96 09/12/2022   TRIG 152 (H) 09/12/2022   CHOLHDL 3.7 09/12/2022    Lab Results  Component Value Date   HGBA1C 5.4 06/13/2022    Assessment & Plan    1. Severe aortic stenosis: S/p AVR on 06/17/2022. Postop echo 07/2022 was stable.  Euvolemic and well compensated on exam. Discussed the use of prophylactic antibiotics before dental work and other surgeries.  He may proceed with dental work as long as he takes his antibiotic prior.  Continue aspirin, will Lasix as needed.     2. CAD: LHC on 05/07/2022 revealed mild nonobstructive CAD with 20% proximal LAD and mid RCA stenoses, focal 70% stenosis in the OM (small caliber vessel). Stable with no anginal symptoms.  Continue aspirin, losartan as below, rosuvastatin as below.    3. Hypertension: BP elevated above goal in office today.  He states it has been elevated at home.  Will increase losartan to 50 mg daily.  Continue to monitor BP and report BP consistently above 130/80.  Otherwise, continue current antihypertensive regimen.   4. Hyperlipidemia: LDL was 96 in 02/2022, of goal.  Will increase rosuvastatin to 20 mg daily.  Repeat lipids, LFTs in 6 weeks.  5.  Dilation of ascending aorta: Stable at 41 mm on most recent echo in 07/2022.  Continue to monitor with routine echocardiograms for aortic stenosis.  6. Disposition: Follow-up in 6 weeks.   HYPERTENSION CONTROL Vitals:   10/15/22 0756 10/15/22 0832  BP: (!) 160/80 (!) 142/98    The patient's blood pressure is elevated above target today.  In order to address the patient's elevated BP: Blood pressure will be monitored at home to determine if medication changes need to be made.; A current anti-hypertensive medication was adjusted today.; Follow up with general cardiology has been recommended.     Lenna Sciara, NP 10/15/2022, 8:40 AM

## 2022-11-04 ENCOUNTER — Other Ambulatory Visit: Payer: Self-pay | Admitting: Physician Assistant

## 2022-11-04 DIAGNOSIS — K219 Gastro-esophageal reflux disease without esophagitis: Secondary | ICD-10-CM

## 2022-11-25 ENCOUNTER — Ambulatory Visit: Payer: 59 | Attending: Nurse Practitioner | Admitting: Nurse Practitioner

## 2022-11-25 ENCOUNTER — Encounter: Payer: Self-pay | Admitting: Nurse Practitioner

## 2022-11-25 VITALS — BP 130/80 | HR 58 | Ht 67.0 in | Wt 190.6 lb

## 2022-11-25 DIAGNOSIS — Z952 Presence of prosthetic heart valve: Secondary | ICD-10-CM

## 2022-11-25 DIAGNOSIS — I350 Nonrheumatic aortic (valve) stenosis: Secondary | ICD-10-CM | POA: Diagnosis not present

## 2022-11-25 DIAGNOSIS — I2510 Atherosclerotic heart disease of native coronary artery without angina pectoris: Secondary | ICD-10-CM | POA: Diagnosis not present

## 2022-11-25 DIAGNOSIS — E785 Hyperlipidemia, unspecified: Secondary | ICD-10-CM

## 2022-11-25 DIAGNOSIS — I10 Essential (primary) hypertension: Secondary | ICD-10-CM

## 2022-11-25 DIAGNOSIS — I77810 Thoracic aortic ectasia: Secondary | ICD-10-CM

## 2022-11-25 NOTE — Progress Notes (Signed)
Office Visit    Patient Name: Darren Lyons Date of Encounter: 11/25/2022  Primary Care Provider:  Marge Duncans, PA-C Primary Cardiologist:  Shelva Majestic, MD  Chief Complaint    56 year old male with a history of severe aortic valve stenosis s/p aortic valve replacement, CAD, hypertension, hyperlipidemia, former tobacco use,  GERD, and depression who presents for follow-up related to hypertension.   Past Medical History    Past Medical History:  Diagnosis Date   Depression    Essential hypertension 09/19/2020   GERD (gastroesophageal reflux disease)    Hyperlipidemia    Hypertension    Migraine    Mixed hyperlipidemia 09/19/2020   Other chest pain 09/19/2020   Other fatigue 09/19/2020   Skin cancer of arm, right    Past Surgical History:  Procedure Laterality Date   AORTIC VALVE REPLACEMENT N/A 06/17/2022   Procedure: AORTIC VALVE REPLACEMENT USING AN INSPIRIS 23MM VALVE;  Surgeon: Gaye Pollack, MD;  Location: [facility];  Service: Open Heart Surgery;  Laterality: N/A;   APPENDECTOMY     LEFT HEART CATH AND CORONARY ANGIOGRAPHY N/A 10/02/2020   Procedure: LEFT HEART CATH AND CORONARY ANGIOGRAPHY;  Surgeon: Troy Sine, MD;  Location: [facility];  Service: Cardiovascular;  Laterality: N/A;   RIGHT HEART CATH N/A 10/02/2020   Procedure: RIGHT HEART CATH;  Surgeon: Troy Sine, MD;  Location: [facility];  Service: Cardiovascular;  Laterality: N/A;   RIGHT HEART CATH AND CORONARY ANGIOGRAPHY N/A 05/07/2022   Procedure: RIGHT HEART CATH AND CORONARY ANGIOGRAPHY;  Surgeon: Troy Sine, MD;  Location: [facility];  Service: Cardiovascular;  Laterality: N/A;   TEE WITHOUT CARDIOVERSION N/A 06/17/2022   Procedure: TRANSESOPHAGEAL ECHOCARDIOGRAM (TEE);  Surgeon: Gaye Pollack, MD;  Location: [facility];  Service: Open Heart Surgery;  Laterality: N/A;    Allergies  Allergies  Allergen Reactions   Codeine Rash    History of Present Illness     56 year old male with the above past medical history of severe aortic valve stenosis s/p valve replacement, CAD, hypertension, hyperlipidemia, former tobacco use, GERD, and depression.   He was previously evaluated by Dr. Geraldo Pitter in October 2021.  He was referred to Dr. Claiborne Billings for diagnostic cardiac catheterization in the setting of episodic chest pain which revealed aortic valve stenosis.  Right heart pressures were upper normal and his aortic valve peak to peak gradient was 43 mmHg with mean gradient of 42 mmHg.  He was found to have mild nonobstructive CAD with 20% stenosis in the LAD, circumflex marginal, and RCA.  Echocardiogram in October 2022 showed EF 60 to 65%, mild LVH, G2 DD, mild biatrial enlargement, suggestion of bicuspid aortic valve with moderate to severe stenosis with a mean gradient of 8.6 and peak gradient of 65.8, mild dilation of ascending aorta 39 mm.  He also underwent amyloid study which was equivocal.  Repeat echocardiogram in April 2023 showed normal LV function, EF 65 to 70%, moderate LVH, significant calcification of the aortic valve with mean gradient 42 mmHg, aortic valve peak gradient 75.7 mmHg. Repeat R/LHC on 05/07/2022 revealed mild nonobstructive CAD with 20% proximal LAD and mid RCA stenoses, focal 70% stenosis in the OM (small caliber vessel), mildly increased right heart pressures with PA pressure 38/13, mean 20 mmHg, heavily calcified aortic valve with reduced excursion, mean gradient 42 mmHg, with peak instantaneous gradient 75.7 mmHg.  He was referred to CT surgery and underwent aortic valve replacement with an Inspiris  Resilia Biprosthetic 23 mm valve on 06/17/2022. Presurgery carotid dopplers revealed 1 to 39% B ICA stenosis.  He did have postop hypertension and was started on losartan, additionally, atenolol was increased.  Postop echocardiogram in 07/2022 showed EF 60 to 65%, normally functioning AVR, mild dilation of ascending aorta, 41 mm.  He was last seen in the  office on 10/15/2022 and was stable from a cardiac standpoint.  His BP was elevated.  Losartan was increased to 50 mg daily.  Additionally, rosuvastatin was increased to 20 mg daily.   He presents today for follow-up.  Since his last visit he has been stable from a cardiac standpoint.  He has been well-controlled.  He denies any symptoms concerning for angina, dyspnea, edema, PND, orthopnea, weight gain.  He does note that when he walks sometimes he feels a heaviness, soreness in his legs.  Discussed possibility of PAD screening, however, patient declines at this time.  Overall, he reports feeling well.  Home Medications    Current Outpatient Medications  Medication Sig Dispense Refill   acetaminophen (TYLENOL) 500 MG tablet Take 1-2 tablets (500-1,000 mg total) by mouth every 6 (six) hours. 30 tablet 0   aspirin EC 325 MG tablet Take 1 tablet (325 mg total) by mouth daily.     atenolol (TENORMIN) 50 MG tablet Take 1 tablet (50 mg total) by mouth daily. 90 tablet 3   fluticasone (FLONASE) 50 MCG/ACT nasal spray PLACE 2 SPRAYS INTO BOTH NOSTRILS DAILY AS NEEDED FOR ALLERGIES OR RHINITIS 48 g 0   furosemide (LASIX) 40 MG tablet Take 1 tablet (40 mg total) by mouth daily as needed. For weight gain of 3 lbs in 24 hours or 5 lbs in 1 week 30 tablet 1   losartan (COZAAR) 50 MG tablet Take 1 tablet (50 mg total) by mouth daily. 90 tablet 3   nitroGLYCERIN (NITROSTAT) 0.4 MG SL tablet Place 1 tablet (0.4 mg total) under the tongue every 5 (five) minutes as needed for chest pain. 25 tablet 3   omeprazole (PRILOSEC) 40 MG capsule Take 1 capsule by mouth once daily 90 capsule 0   PARoxetine (PAXIL) 10 MG tablet Take 1 tablet by mouth once daily 90 tablet 0   potassium chloride SA (KLOR-CON M) 20 MEQ tablet Take 1 tablet (20 mEq total) by mouth daily as needed. Only take on days you take a Lasix 30 tablet 1   rosuvastatin (CRESTOR) 20 MG tablet Take 1 tablet (20 mg total) by mouth daily. 90 tablet 3    amoxicillin (AMOXIL) 500 MG capsule Take four 500 mg capsules 30-60 mins prior to dental procedures. (Patient not taking: Reported on 11/25/2022) 4 capsule 3   No current facility-administered medications for this visit.     Review of Systems    He denies chest pain, palpitations, dyspnea, pnd, orthopnea, n, v, dizziness, syncope, edema, weight gain, or early satiety. All other systems reviewed and are otherwise negative except as noted above.   Physical Exam    VS:  BP 130/80   Pulse (!) 58   Ht '5\' 7"'$  (1.702 m)   Wt 190 lb 9.6 oz (86.5 kg)   SpO2 98%   BMI 29.85 kg/m  GEN: Well nourished, well developed, in no acute distress. HEENT: normal. Neck: Supple, no JVD, carotid bruits, or masses. Cardiac: RRR, no murmurs, rubs, or gallops. No clubbing, cyanosis, edema.  Radials/DP/PT 2+ and equal bilaterally.  Respiratory:  Respirations regular and unlabored, clear to auscultation bilaterally. GI: Soft,  nontender, nondistended, BS + x 4. MS: no deformity or atrophy. Skin: warm and dry, no rash. Neuro:  Strength and sensation are intact. Psych: Normal affect.  Accessory Clinical Findings    ECG personally reviewed by me today -no EKG in office today.   Lab Results  Component Value Date   WBC 7.7 09/12/2022   HGB 15.3 09/12/2022   HCT 44.9 09/12/2022   MCV 83 09/12/2022   PLT 209 09/12/2022   Lab Results  Component Value Date   CREATININE 1.15 09/12/2022   BUN 15 09/12/2022   NA 142 09/12/2022   K 5.0 09/12/2022   CL 100 09/12/2022   CO2 24 09/12/2022   Lab Results  Component Value Date   ALT 17 09/12/2022   AST 20 09/12/2022   ALKPHOS 90 09/12/2022   BILITOT 0.6 09/12/2022   Lab Results  Component Value Date   CHOL 169 09/12/2022   HDL 46 09/12/2022   LDLCALC 96 09/12/2022   TRIG 152 (H) 09/12/2022   CHOLHDL 3.7 09/12/2022    Lab Results  Component Value Date   HGBA1C 5.4 06/13/2022    Assessment & Plan    1. Hypertension: BP well controlled. Continue  current antihypertensive regimen.   2. Severe aortic stenosis: S/p AVR on 06/17/2022. Postop echo 07/2022 was stable. Euvolemic and well compensated on exam. Discussed the use of prophylactic antibiotics before dental work and other surgeries. Continue aspirin, Lasix as needed.     3. CAD: LHC on 05/07/2022 revealed mild nonobstructive CAD with 20% proximal LAD and mid RCA stenoses, focal 70% stenosis in the OM (small caliber vessel). Stable with no anginal symptoms.  Continue aspirin, losartan, rosuvastatin.  4. Hyperlipidemia: LDL was 96 in 02/2022, above goal.  Rosuvastatin was recently increased.  Will repeat fasting lipids, LFTs today.  Continue Crestor.   5.  Dilation of ascending aorta: Stable at 41 mm on most recent echo in 07/2022.  Continue to monitor with routine echocardiograms for aortic stenosis.    6. Disposition: Follow-up in 4-6 months.      Lenna Sciara, NP 11/25/2022, 10:22 AM

## 2022-11-25 NOTE — Patient Instructions (Addendum)
Medication Instructions:  Your physician recommends that you continue on your current medications as directed. Please refer to the Current Medication list given to you today.   *If you need a refill on your cardiac medications before your next appointment, please call your pharmacy*   Lab Work: Your physician recommends that you complete lab work today. Lipid Panel & LFTs  If you have labs (blood work) drawn today and your tests are completely normal, you will receive your results only by: MyChart Message (if you have MyChart) OR A paper copy in the mail If you have any lab test that is abnormal or we need to change your treatment, we will call you to review the results.   Testing/Procedures: NONE ordered at this time of appointment   Follow-Up: At [facility], you and your health needs are our priority.  As part of our continuing mission to provide you with exceptional heart care, we have created designated Provider Care Teams.  These Care Teams include your primary Cardiologist (physician) and Advanced Practice Providers (APPs -  Physician Assistants and Nurse Practitioners) who all work together to provide you with the care you need, when you need it.  We recommend signing up for the patient portal called "MyChart".  Sign up information is provided on this After Visit Summary.  MyChart is used to connect with patients for Virtual Visits (Telemedicine).  Patients are able to view lab/test results, encounter notes, upcoming appointments, etc.  Non-urgent messages can be sent to your provider as well.   To learn more about what you can do with MyChart, go to [email or web address].    Your next appointment:   4-6 month(s)  The format for your next appointment:   In Person  Provider:   Shelva Majestic, MD     Other Instructions   Important Information About Sugar

## 2022-11-26 LAB — HEPATIC FUNCTION PANEL
ALT: 10 IU/L (ref 0–44)
AST: 17 IU/L (ref 0–40)
Albumin: 4.6 g/dL (ref 3.8–4.9)
Alkaline Phosphatase: 101 IU/L (ref 44–121)
Bilirubin Total: 0.7 mg/dL (ref 0.0–1.2)
Bilirubin, Direct: 0.14 mg/dL (ref 0.00–0.40)
Total Protein: 6.6 g/dL (ref 6.0–8.5)

## 2022-11-26 LAB — LIPID PANEL
Chol/HDL Ratio: 3.4 ratio (ref 0.0–5.0)
Cholesterol, Total: 135 mg/dL (ref 100–199)
HDL: 40 mg/dL (ref >39)
LDL Chol Calc (NIH): 70 mg/dL (ref 0–99)
Triglycerides: 141 mg/dL (ref 0–149)
VLDL Cholesterol Cal: 25 mg/dL (ref 5–40)

## 2022-12-28 ENCOUNTER — Other Ambulatory Visit: Payer: Self-pay | Admitting: Physician Assistant

## 2022-12-28 DIAGNOSIS — F320 Major depressive disorder, single episode, mild: Secondary | ICD-10-CM

## 2022-12-31 ENCOUNTER — Other Ambulatory Visit: Payer: Self-pay | Admitting: Physician Assistant

## 2022-12-31 DIAGNOSIS — F320 Major depressive disorder, single episode, mild: Secondary | ICD-10-CM

## 2023-01-07 ENCOUNTER — Other Ambulatory Visit: Payer: Self-pay | Admitting: Physician Assistant

## 2023-01-07 DIAGNOSIS — K219 Gastro-esophageal reflux disease without esophagitis: Secondary | ICD-10-CM

## 2023-03-17 ENCOUNTER — Ambulatory Visit: Payer: 59 | Admitting: Physician Assistant

## 2023-04-02 ENCOUNTER — Encounter: Payer: Self-pay | Admitting: Cardiovascular Disease

## 2023-04-02 ENCOUNTER — Ambulatory Visit: Payer: 59 | Attending: Cardiovascular Disease | Admitting: Cardiovascular Disease

## 2023-04-02 DIAGNOSIS — Z952 Presence of prosthetic heart valve: Secondary | ICD-10-CM | POA: Diagnosis not present

## 2023-04-02 DIAGNOSIS — I2510 Atherosclerotic heart disease of native coronary artery without angina pectoris: Secondary | ICD-10-CM

## 2023-04-02 DIAGNOSIS — G4733 Obstructive sleep apnea (adult) (pediatric): Secondary | ICD-10-CM

## 2023-04-02 DIAGNOSIS — G4719 Other hypersomnia: Secondary | ICD-10-CM

## 2023-04-02 DIAGNOSIS — Q2381 Bicuspid aortic valve: Secondary | ICD-10-CM

## 2023-04-02 DIAGNOSIS — Q231 Congenital insufficiency of aortic valve: Secondary | ICD-10-CM

## 2023-04-02 DIAGNOSIS — I10 Essential (primary) hypertension: Secondary | ICD-10-CM

## 2023-04-02 DIAGNOSIS — E785 Hyperlipidemia, unspecified: Secondary | ICD-10-CM

## 2023-04-02 DIAGNOSIS — I77810 Thoracic aortic ectasia: Secondary | ICD-10-CM

## 2023-04-02 MED ORDER — LOSARTAN POTASSIUM-HCTZ 100-12.5 MG PO TABS: 1.0000 | ORAL_TABLET | Freq: Every day | ORAL | 3 refills | Status: DC

## 2023-04-02 MED ORDER — ASPIRIN 81 MG PO TBEC: 81.0000 mg | DELAYED_RELEASE_TABLET | Freq: Every day | ORAL | 3 refills | Status: AC

## 2023-04-02 NOTE — Patient Instructions (Addendum)
Medication Instructions:  STOP Losartan  START Losartan-HCTZ 100-12.5 mg daily.   *If you need a refill on your cardiac medications before your next appointment, please call your pharmacy*   Lab Work: Have complete labs completed with your primary care, have them send [place] a copy once completed fax number [phone]  If you have labs (blood work) drawn today and your tests are completely normal, you will receive your results only by: MyChart Message (if you have MyChart) OR A paper copy in the mail If you have any lab test that is abnormal or we need to change your treatment, we will call you to review the results.   Testing/Procedures: Echocardiogram (September) - Your physician has requested that you have an echocardiogram. Echocardiography is a painless test that uses sound waves to create images of your heart. It provides your doctor with information about the size and shape of your heart and how well your heart's chambers and valves are working. This procedure takes approximately one hour. There are no restrictions for this procedure.   Your physician has recommended that you have a sleep study. This test records several body functions during sleep, including: brain activity, eye movement, oxygen and carbon dioxide blood levels, heart rate and rhythm, breathing rate and rhythm, the flow of air through your mouth and nose, snoring, body muscle movements, and chest and belly movement.    Follow-Up: At [facility], you and your health needs are our priority.  As part of our continuing mission to provide you with exceptional heart care, we have created designated Provider Care Teams.  These Care Teams include your primary Cardiologist (physician) and Advanced Practice Providers (APPs -  Physician Assistants and Nurse Practitioners) who all work together to provide you with the care you need, when you need it.  We recommend signing up for the patient portal called "MyChart".   Sign up information is provided on this After Visit Summary.  MyChart is used to connect with patients for Virtual Visits (Telemedicine).  Patients are able to view lab/test results, encounter notes, upcoming appointments, etc.  Non-urgent messages can be sent to your provider as well.   To learn more about what you can do with MyChart, go to [email or web address].    Your next appointment:   October 2024  Provider:   Nicki Guadalajara, MD

## 2023-04-02 NOTE — Progress Notes (Signed)
Cardiology Office Note    Date:  04/02/2023   ID:  Thaxton Pelley, DOB 1966-12-19, MRN [id number]  PCP:  Marianne Sofia, PA-C  Cardiologist:  Nicki Guadalajara, MD   11 month F/U   History of Present Illness:  Darren Lyons is a 57 y.o. male who has a history of hypertension, CAD, hyperlipidemia, aortic valve stenosis status post AVR.  He presents for follow-up evaluation.  [name] was evaluated by Dr. Tomie China and in October 2021 was referred to me for diagnostic catheterization with complaints episodic chest pain.  During catheterization, there was a suggestion of aortic valve stenosis and as result right heart catheterization was performed.  Right heart pressures were upper normal and his aortic valve peak to peak gradient was 43 with a mean gradient of 42.  He was found to have mild nonobstructive CAD with 20% stenoses in the LAD, circumflex marginal, and RCA.  He underwent echo Doppler study in October 2022 which showed EF 60 to 65%, mild LVH, grade 2 diastolic dysfunction, mild biatrial enlargement, with suggestion of a bicuspid aortic valve with moderate to severe stenosis with a mean gradient of 38.6 and peak gradient of 65.8.  mild dilation of ascending aorta 39 mm.  He also underwent an amyloid study which was equivocal.  Recently, he was evaluated by Gillian Shields, NP.  He denies any recent chest pain.  He plays disc golf and walking fast uphill he has noticed exertional dyspnea.  He denies any recent chest pain.  He denies any presyncope or syncope.  He was referred for a follow-up echo Doppler study on April 09, 2022 continues to show normal LV function with EF 65 to 70% with moderate left ventricular hypertrophy.  Significant calcification of the aortic valve with mean gradient of 42 mm and aortic valve peak gradient at 75.7 mm.  Ascending aorta was 40 mm.  I saw him on May 01, 2022 follow-up of his echo Doppler assessment.  At that time he admitted to missing some shortness of  breath with increased activity. His major complaint is that of fatigability and no energy and feeling tired.  He was unaware of any arrhythmias.  He deniesdpresyncope or syncope.  He typically goes to bed at 10 PM and wakes up at 4.  An Epworth Sleepiness Scale score was calculated in the office today and this endorsed at 17 consistent with excessive daytime sleepiness.  He had an episode of COVID in February 2022 and still has not recovered with reference to his sense of taste and smell. During that evaluation with his progressive aortic stenosis and exertional dyspnea I recommended definitive R/L heart cath prior to need for aortic valve replacement surgery.   On May 07, 2022 I performed right and left heart cardiac catheterization.  He had mild nonobstructive CAD with 20% proximal LAD and mid RCA stenoses.  There was focal 70% stenosis in small OM vessel.  He had mildly increased right heart pressures with PA pressure 38/13, mean 20.  His aortic valve was severely calcified with reduced excursion.  He underwent successful aortic valve replacement by Dr. Evelene Croon on June 17, 2022 and received a 23 mm Edwards Inspiris Resilia bioprosthetic valve.  He was discharged on June 21, 2022.  He tolerated surgery well.  Subsequent echo done on July 30, 2022 normal LV function with EF 60 to 65%.  The bioprosthetic aortic valve was well-seated.  There was no AR or stenosis.  He had  mild dilation of ascending aorta at 41 mm.  He was evaluated by Anice Paganini, NP on November 25, 2022 remained stable from a cardiovascular standpoint.  As only, [name] feels well.  He continues to work.  He denies chest pain PND orthopnea.  He is unaware of palpitations.  He states his blood pressure typically is elevated and at home may be 1 40-1 50 systolically.  Medical regimen of aspirin 325 mg, atenolol 50 mg, losartan 50 mg,.  He also is on rosuvastatin 20 mg for hyperlipidemia and takes omeprazole for GERD and Paxil for  depression.  He admits to significant fatigability and excessive daytime sleepiness.  He never had a sleep study.  An Epworth Sleepiness Scale score was recalculated in the office today and this endorsed at 18 consistent with significant excessive daytime sleepiness.  He presents for evaluation.   Past Medical History:  Diagnosis Date   Depression    Essential hypertension 09/19/2020   GERD (gastroesophageal reflux disease)    Hyperlipidemia    Hypertension    Migraine    Mixed hyperlipidemia 09/19/2020   Other chest pain 09/19/2020   Other fatigue 09/19/2020   Skin cancer of arm, right     Past Surgical History:  Procedure Laterality Date   AORTIC VALVE REPLACEMENT N/A 06/17/2022   Procedure: AORTIC VALVE REPLACEMENT USING AN INSPIRIS VALVE;  Surgeon: Alleen Borne, MD;  Location: [facility] OR;  Service: Open Heart Surgery;  Laterality: N/A;   APPENDECTOMY     LEFT HEART CATH AND CORONARY ANGIOGRAPHY N/A 10/02/2020   Procedure: LEFT HEART CATH AND CORONARY ANGIOGRAPHY;  Surgeon: Lennette Bihari, MD;  Location: [facility] INVASIVE CV LAB;  Service: Cardiovascular;  Laterality: N/A;   RIGHT HEART CATH N/A 10/02/2020   Procedure: RIGHT HEART CATH;  Surgeon: Lennette Bihari, MD;  Location: [facility] INVASIVE CV LAB;  Service: Cardiovascular;  Laterality: N/A;   RIGHT HEART CATH AND CORONARY ANGIOGRAPHY N/A 05/07/2022   Procedure: RIGHT HEART CATH AND CORONARY ANGIOGRAPHY;  Surgeon: Lennette Bihari, MD;  Location: [facility] INVASIVE CV LAB;  Service: Cardiovascular;  Laterality: N/A;   TEE WITHOUT CARDIOVERSION N/A 06/17/2022   Procedure: TRANSESOPHAGEAL ECHOCARDIOGRAM (TEE);  Surgeon: Alleen Borne, MD;  Location: [facility] OR;  Service: Open Heart Surgery;  Laterality: N/A;    Current Medications: Outpatient Medications Prior to Visit  Medication Sig Dispense Refill   acetaminophen (TYLENOL) 500 MG tablet Take 1-2 tablets (500-1,000 mg total) by mouth every 6 (six) hours. 30 tablet 0   amoxicillin (AMOXIL) 500  MG capsule Take four 500 mg capsules 30-60 mins prior to dental procedures. 4 capsule 3   atenolol (TENORMIN) 50 MG tablet Take 1 tablet (50 mg total) by mouth daily. 90 tablet 3   fluticasone (FLONASE) 50 MCG/ACT nasal spray PLACE 2 SPRAYS INTO BOTH NOSTRILS DAILY AS NEEDED FOR ALLERGIES OR RHINITIS 48 g 0   furosemide (LASIX) 40 MG tablet Take 1 tablet (40 mg total) by mouth daily as needed. For weight gain of 3 lbs in 24 hours or 5 lbs in 1 week 30 tablet 1   nitroGLYCERIN (NITROSTAT) 0.4 MG SL tablet Place 1 tablet (0.4 mg total) under the tongue every 5 (five) minutes as needed for chest pain. 25 tablet 3   omeprazole (PRILOSEC) 40 MG capsule Take 1 capsule by mouth once daily 90 capsule 0   PARoxetine (PAXIL) 10 MG tablet Take 1 tablet by mouth once daily 90 tablet 0   potassium chloride SA (  KLOR-CON M) 20 MEQ tablet Take 1 tablet (20 mEq total) by mouth daily as needed. Only take on days you take a Lasix 30 tablet 1   rosuvastatin (CRESTOR) 20 MG tablet Take 1 tablet (20 mg total) by mouth daily. 90 tablet 3   aspirin EC 325 MG tablet Take 1 tablet (325 mg total) by mouth daily.     losartan (COZAAR) 50 MG tablet Take 1 tablet (50 mg total) by mouth daily. 90 tablet 3   No facility-administered medications prior to visit.     Allergies:   Codeine   Social History   Socioeconomic History   Marital status: Married    Spouse name: Not on file   Number of children: Not on file   Years of education: Not on file   Highest education level: Not on file  Occupational History   Not on file  Tobacco Use   Smoking status: Former    Types: Cigarettes   Smokeless tobacco: Never   Tobacco comments:    Vape pen with THC. Daily   Vaping Use   Vaping Use: Every day  Substance and Sexual Activity   Alcohol use: Never   Drug use: Yes    Types: Marijuana    Comment: Vape THC   Sexual activity: Not on file  Other Topics Concern   Not on file  Social History Narrative   Not on file    Social Determinants of Health   Financial Resource Strain: Not on file  Food Insecurity: Not on file  Transportation Needs: Not on file  Physical Activity: Not on file  Stress: Not on file  Social Connections: Not on file    Socially, he upholsters furniture.  He is married for 19 years.  He has 3 children, 6 grandchildren.  Pleated 12th grade.  He has a previous tobacco use and quit October 2022.  He does vape occasionally.  His exercise consists of disc golf as well as regular golf.  Family History:  The patient's family history includes Cancer in his father; Diabetes in his mother; Hypertension in his mother.  His mother is living at age 58 and has diabetes.  Father died of heart disease at age 24.  He has 2 living brothers ages 47 and 16.  His children are 40, 31 and 29.  ROS General: Negative; No fevers, chills, or night sweats;  HEENT: Negative; No changes in vision or hearing, sinus congestion, difficulty swallowing Pulmonary: Negative; No cough, wheezing, shortness of breath, hemoptysis Cardiovascular: See HPI GI: Negative; No nausea, vomiting, diarrhea, or abdominal pain GU: Negative; No dysuria, hematuria, or difficulty voiding Musculoskeletal: Negative; no myalgias, joint pain, or weakness Hematologic/Oncology: Negative; no easy bruising, bleeding Endocrine: Negative; no heat/cold intolerance; no diabetes Neuro: Negative; no changes in balance, headaches Skin: Negative; No rashes or skin lesions Psychiatric: Negative; No behavioral problems, depression Sleep: Excessive daytime sleepiness.  He typically goes to bed at 10 and wakes up at 4 AM sleeping only 6 hours.     Epworth Sleepiness Scale: Situation   Chance of Dozing/Sleeping (0 = never , 1 = slight chance , 2 = moderate chance , 3 = high chance )   sitting and reading 3   watching TV 3   sitting inactive in a public place 2   being a passenger in a motor vehicle for an hour or more 3   lying down in the  afternoon 3   sitting and talking to someone 1  sitting quietly after lunch (no alcohol) 3   while stopped for a few minutes in traffic as the driver 0   Total Score  18     Other comprehensive 14 point system review is negative.   PHYSICAL EXAM:   VS:  BP (!) 158/88   Pulse 61   Ht 5\' 7"  (1.702 m)   Wt 198 lb 9.6 oz (90.1 kg)   BMI 31.11 kg/m     Repeat blood pressure by me 162/88  Wt Readings from Last 3 Encounters:  04/02/23 198 lb 9.6 oz (90.1 kg)  11/25/22 190 lb 9.6 oz (86.5 kg)  10/15/22 191 lb (86.6 kg)     General: Alert, oriented, no distress.  Skin: normal turgor, no rashes, warm and dry HEENT: Normocephalic, atraumatic. Pupils equal round and reactive to light; sclera anicteric; extraocular muscles intact;  Nose without nasal septal hypertrophy Mouth/Parynx benign; Mallinpatti scale 3 Neck: No JVD, no carotid bruits; normal carotid upstroke Lungs: clear to ausculatation and percussion; no wheezing or rales Chest wall: without tenderness to palpitation Heart: PMI not displaced, RRR, s1 s2 normal, 1/6 systolic murmur, no diastolic murmur, no rubs, gallops, thrills, or heaves Abdomen: soft, nontender; no hepatosplenomehaly, BS+; abdominal aorta nontender and not dilated by palpation. Back: no CVA tenderness Pulses 2+ Musculoskeletal: full range of motion, normal strength, no joint deformities Extremities: no clubbing cyanosis or edema, Homan's sign negative  Neurologic: grossly nonfocal; Cranial nerves grossly wnl Psychologic: Normal mood and affect   Studies/Labs Reviewed:   April 02, 2023 ECG (independently read by me): NSR at 61; T wave abnormality  May 01, 2022 ECG (independently read by me): NSR at 70; inferolateral ST changes  Recent Labs:    Latest Ref Rng & Units 09/12/2022    1:50 PM 07/09/2022   11:41 AM 06/19/2022    4:03 AM  BMP  Glucose 70 - 99 mg/dL 95  87  161   BUN 6 - 24 mg/dL 15  22  20    Creatinine 0.76 - 1.27 mg/dL 0.96  0.45  4.09    BUN/Creat Ratio 9 - 20 13  24     Sodium 134 - 144 mmol/L 142  141  138   Potassium 3.5 - 5.2 mmol/L 5.0  4.7  4.0   Chloride 96 - 106 mmol/L 100  103  102   CO2 20 - 29 mmol/L 24  25  29    Calcium 8.7 - 10.2 mg/dL 9.7  9.5  8.2         Latest Ref Rng & Units 11/25/2022   10:36 AM 09/12/2022    1:50 PM 06/13/2022   11:30 AM  Hepatic Function  Total Protein 6.0 - 8.5 g/dL 6.6  7.2  6.6   Albumin 3.8 - 4.9 g/dL 4.6  5.0  4.2   AST 0 - 40 IU/L 17  20  18    ALT 0 - 44 IU/L 10  17  15    Alk Phosphatase 44 - 121 IU/L 101  90  66   Total Bilirubin 0.0 - 1.2 mg/dL 0.7  0.6  0.7   Bilirubin, Direct 0.00 - 0.40 mg/dL 8.11          Latest Ref Rng & Units 09/12/2022    1:50 PM 07/09/2022   11:41 AM 06/21/2022    1:34 AM  CBC  WBC 3.4 - 10.8 x10E3/uL 7.7  6.5  10.6   Hemoglobin 13.0 - 17.7 g/dL 91.4  78.2  95.6  Hematocrit 37.5 - 51.0 % 44.9  36.1  33.7   Platelets 150 - 450 x10E3/uL 209  308  141    Lab Results  Component Value Date   MCV 83 09/12/2022   MCV 86 07/09/2022   MCV 85.1 06/21/2022   Lab Results  Component Value Date   TSH 2.350 09/12/2022   Lab Results  Component Value Date   HGBA1C 5.4 06/13/2022     BNP No results found for: "BNP"  ProBNP No results found for: "PROBNP"   Lipid Panel     Component Value Date/Time   CHOL 135 11/25/2022 1036   TRIG 141 11/25/2022 1036   HDL 40 11/25/2022 1036   CHOLHDL 3.4 11/25/2022 1036   LDLCALC 70 11/25/2022 1036   LABVLDL 25 11/25/2022 1036     RADIOLOGY: No results found.   Additional studies/ records that were reviewed today include:   R/L CATH: 10/02/2020 2nd Mrg-1 lesion is 20% stenosed. 2nd Mrg-2 lesion is 20% stenosed. Mid RCA lesion is 20% stenosed. Prox LAD to Mid LAD lesion is 20% stenosed. The left ventricular systolic function is normal. LV end diastolic pressure is normal. The left ventricular ejection fraction is greater than 65% by visual estimate.   Mild nonobstructive CAD with  common left main ostium with 20% proximal LAD narrowing before the first diagonal vessel; 20% circumflex stenoses; and 20% mid RCA stenosis in a dominant RCA supplying the entire posterior wall to the apex.   Systemic hypertension,   Minimally elevated right heart pressures with mild pulmonary hypertension with a PA pressure mean at 26 mmHg.   Severe aortic valve stenosis with a mean gradient of 42.7 mmHg and a valve area of 0.6 cm.   RECOMMENDATION: I have ordered an echo Doppler study to be done following the patient's cardiac catheterization.  Cardiac auscultation reveals at least a 2/6 mid peaking systolic murmur in the aortic position consistent with his aortic stenosis.  Plan is for discharge later today.  He will follow up with Dr. Tomie Chinaevankar.  I suspect the patient's recent increasing symptomatology is secondary to his aortic valve stenosis rather than coronary obstructive disease.  Right Heart Pressures RA: A-wave 10, V wave 8, mean 7 RV: 31/9 PA: 34/18; mean 26 PW: A-wave 17, V wave 14; mean 15  Initial AO: 152/86          LV: 202/23  Pullback: LV: 212/23                 AO: 189/93  Oxygen saturation in the central aorta 98% and in the pulmonary artery 67%. By the Fick method, cardiac output 3.9 L/min with a cardiac index of 2.0 L/min/m.  PVR: 2.8 WU  Aortic valve peak to peak gradient 43, mean gradient 42.7. Aortic valve area 0.6 cm.        ECHO: 04/09/2022  1. Left ventricular ejection fraction, by estimation, is 65 to 70%. The  left ventricle has normal function. The left ventricle has no regional  wall motion abnormalities. There is moderate left ventricular hypertrophy.  Left ventricular diastolic  parameters are indeterminate.   2. Right ventricular systolic function is normal. The right ventricular  size is normal.   3. Right atrial size was mildly dilated.   4. The mitral valve is degenerative. No evidence of mitral valve  regurgitation. No evidence of  mitral stenosis. Moderate mitral annular  calcification.   5. Aortic dilatation noted. There is dilatation of the ascending aorta,  measuring 40  mm.   6. The inferior vena cava is normal in size with greater than 50%  respiratory variability, suggesting right atrial pressure of 3 mmHg.   7. The aortic valve is calcified. There is severe calcifcation of the  aortic valve. Aortic valve regurgitation is not visualized. Severe aortic  valve stenosis. Vmax 4.4 m/s, MG 42 mmHg, AVA 0.8 cm^2, DI 0.21   Comparison(s): EF 60%, mild LVH, mod MAC, mod AS mean 38.6 mmHg, peak 65.8  mmHg, asc aor 38 mm, GLS -14.3%.    R/L HEART CATH: 05/07/2022   Prox LAD lesion is 20% stenosed.   2nd Mrg lesion is 70% stenosed.   Prox RCA lesion is 20% stenosed.   There is severe aortic valve stenosis.   Mild nonobstructive CAD with 20% proximal LAD and mid RCA stenoses.  There is focal 70% stenosis in the OM vessel which is very small caliber.   Mildly increased right heart pressures with PA pressure 38/13; mean 20.   Severely calcified aortic valve with reduced excursion.  The valve was not crossed and on most recent echo Doppler study from April 2023, mean gradient 42 with peak instantaneous gradient 75.7 millimeters mercury and aortic valve area at 0.8 cm.   RECOMMENDATION: The patient will be referred to Dr. Evelene Croon for surgical evaluation of his aortic stenosis.  ECHO: 07/30/2021  1. Left ventricular ejection fraction, by estimation, is 60 to 65%. The  left ventricle has normal function. The left ventricle has no regional  wall motion abnormalities. There is mild left ventricular hypertrophy of  the basal-septal segment. Left  ventricular diastolic parameters were normal. The average left ventricular  global longitudinal strain is -20.2 %. The global longitudinal strain is  normal.   2. Right ventricular systolic function is normal. The right ventricular  size is normal.   3. The mitral valve is  normal in structure. Trivial mitral valve  regurgitation. No evidence of mitral stenosis.   4. The aortic valve has been repaired/replaced. Aortic valve  regurgitation is not visualized. No aortic stenosis is present. Aortic  valve area, by VTI measures 2.05 cm. Aortic valve mean gradient measures  6.0 mmHg. Aortic valve Vmax measures 1.63 m/s.   5. Aortic dilatation noted. There is mild dilatation of the ascending  aorta, measuring 41 mm.   6. The inferior vena cava is normal in size with greater than 50%  respiratory variability, suggesting right atrial pressure of 3 mmHg.   7. Compared to prior echo, a bioprosthetic AVR is now present. o     ASSESSMENT:    1. Bicuspid aortic valve   2. S/P AVR   3. Coronary artery disease involving native coronary artery of native heart without angina pectoris   4. Evaluate for OSA (obstructive sleep apnea)   5. Excessive daytime sleepiness   6. Essential hypertension   7. Ascending aorta dilatation   8. Hyperlipidemia LDL goal <70     PLAN:  Mr. Jerramy Furtado is a 57 year old gentleman who had remotely seen Dr. Tomie China and in 2021 started to notice episodes of intermittent chest tightness.  He was referred to me to undergo diagnostic left heart catheterization but due to concerns for concomitant aortic stenosis also performed right heart catheterization.  He was found to have mild nonobstructive CAD but significant aortic stenosis with a mean gradient 42.7 mm.  He was referred back to Dr. Tomie China.  Apparently, in October 2022 a follow-up echo Doppler study with normal LV function with mild  concentric LVH and grade 2 diastolic dysfunction.  He had elevated left atrial pressure.  There was mild biatrial enlargement.  The aortic valve mean gradient was 38.6, gradient 65.8 and estimated valve area 0.8 cm.  At time, he also underwent an amyloid study which was equivocal.  He contracted COVID February 2022 and lost his sense of taste and smell.  He had  significant fatigability thereafter.  He has recently been evaluated by Gillian Shields, NP and his most recent follow-up echo Doppler study now shows a peak aortic valve gradient of 42.7 with a mean gradient of 75.7.  He was referred to me for further evaluation in May 2023.  At that time, I recommended he undergo definitive right left heart catheterization which confirmed severe aortic valve stenosis and mild nonobstructive CAD.  That time, he also was experiencing symptoms of significant excessive daytime sleepiness and I had discussed future sleep evaluation.  I have not seen him in the office since his precatheterization evaluation.  He subsequently underwent successful AVR with a 23 mm Edwards Spira's Resilia pericardial valve by Dr. Laneta Simmers.  Subsequently he has done well with follow-up echocardiography showing an excellent result in August 2023.  Only, he has noticed blood pressure elevation at home with blood pressures consistently 1 40-1 50.  Blood pressure was elevated today.  He has been on losartan 50 mg and atenolol 50 mg daily.  Also admits to some occasional ankle swelling.  I have recommended he can discontinue losartan 50 mg and in its place we will change this to losartan HCT 100/12.5 mg and he will continue with his current dose of atenolol.  I recalculated an Epworth Sleepiness Scale score today which endorsed at 18.  I have recommended we schedule him for an in lab sleep study for evaluation.  He will continue his current dose of rosuvastatin 20 mg daily.  I have recommended he decrease the aspirin to 81 mg.  Changes to be on rosuvastatin 20 mg daily for hyperlipidemia.  I will contact him regarding his sleep study to arrange for follow-up evaluation.  In September I have suggested he undergo a 1 year follow-up echo Doppler study following his arctic valve replacement and I will see him in October for follow-up evaluation or sooner as needed.    Medication Adjustments/Labs and Tests  Ordered: Current medicines are reviewed at length with the patient today.  Concerns regarding medicines are outlined above.  Medication changes, Labs and Tests ordered today are listed in the Patient Instructions below. Patient Instructions  Medication Instructions:  STOP Losartan  START Losartan-HCTZ 100-12.5 mg daily.   *If you need a refill on your cardiac medications before your next appointment, please call your pharmacy*   Lab Work: Have complete labs completed with your primary care, have them send [place] a copy once completed fax number [phone]  If you have labs (blood work) drawn today and your tests are completely normal, you will receive your results only by: MyChart Message (if you have MyChart) OR A paper copy in the mail If you have any lab test that is abnormal or we need to change your treatment, we will call you to review the results.   Testing/Procedures: Echocardiogram (September) - Your physician has requested that you have an echocardiogram. Echocardiography is a painless test that uses sound waves to create images of your heart. It provides your doctor with information about the size and shape of your heart and how well your heart's chambers and valves  are working. This procedure takes approximately one hour. There are no restrictions for this procedure.   Your physician has recommended that you have a sleep study. This test records several body functions during sleep, including: brain activity, eye movement, oxygen and carbon dioxide blood levels, heart rate and rhythm, breathing rate and rhythm, the flow of air through your mouth and nose, snoring, body muscle movements, and chest and belly movement.    Follow-Up: At [facility], you and your health needs are our priority.  As part of our continuing mission to provide you with exceptional heart care, we have created designated Provider Care Teams.  These Care Teams include your primary Cardiologist  (physician) and Advanced Practice Providers (APPs -  Physician Assistants and Nurse Practitioners) who all work together to provide you with the care you need, when you need it.  We recommend signing up for the patient portal called "MyChart".  Sign up information is provided on this After Visit Summary.  MyChart is used to connect with patients for Virtual Visits (Telemedicine).  Patients are able to view lab/test results, encounter notes, upcoming appointments, etc.  Non-urgent messages can be sent to your provider as well.   To learn more about what you can do with MyChart, go to [email or web address].    Your next appointment:   October 2024  Provider:   Nicki Guadalajara, MD       Signed, Nicki Guadalajara, MD  04/02/2023 1:15 PM    [facility] [address], [address], [place], [place]  [address] Phone: [phone]

## 2023-04-30 ENCOUNTER — Other Ambulatory Visit: Payer: Self-pay | Admitting: Physician Assistant

## 2023-04-30 DIAGNOSIS — K219 Gastro-esophageal reflux disease without esophagitis: Secondary | ICD-10-CM

## 2023-05-02 ENCOUNTER — Other Ambulatory Visit (HOSPITAL_COMMUNITY): Payer: 59

## 2023-06-25 ENCOUNTER — Other Ambulatory Visit: Payer: Self-pay | Admitting: Physician Assistant

## 2023-06-25 DIAGNOSIS — F320 Major depressive disorder, single episode, mild: Secondary | ICD-10-CM

## 2023-08-01 ENCOUNTER — Other Ambulatory Visit: Payer: Self-pay | Admitting: Physician Assistant

## 2023-08-01 DIAGNOSIS — K219 Gastro-esophageal reflux disease without esophagitis: Secondary | ICD-10-CM

## 2023-09-02 ENCOUNTER — Ambulatory Visit (HOSPITAL_COMMUNITY): Payer: 59 | Attending: Cardiology

## 2023-09-02 DIAGNOSIS — Z952 Presence of prosthetic heart valve: Secondary | ICD-10-CM | POA: Insufficient documentation

## 2023-09-02 LAB — ECHOCARDIOGRAM COMPLETE
AR max vel: 1.76 cm2
AV Area VTI: 1.86 cm2
AV Area mean vel: 1.79 cm2
AV Mean grad: 7.5 mmHg
AV Peak grad: 15.3 mmHg
Ao pk vel: 1.96 m/s
Area-P 1/2: 3.72 cm2
S' Lateral: 2.5 cm

## 2023-09-10 ENCOUNTER — Other Ambulatory Visit (HOSPITAL_BASED_OUTPATIENT_CLINIC_OR_DEPARTMENT_OTHER): Payer: Self-pay | Admitting: Cardiovascular Disease

## 2023-10-15 ENCOUNTER — Other Ambulatory Visit: Payer: Self-pay | Admitting: Nurse Practitioner

## 2023-10-24 ENCOUNTER — Encounter: Payer: Self-pay | Admitting: Nurse Practitioner

## 2023-10-24 ENCOUNTER — Ambulatory Visit: Payer: 59 | Attending: Nurse Practitioner | Admitting: Nurse Practitioner

## 2023-10-24 ENCOUNTER — Other Ambulatory Visit: Payer: Self-pay

## 2023-10-24 VITALS — BP 124/68 | HR 68 | Ht 67.0 in | Wt 209.0 lb

## 2023-10-24 DIAGNOSIS — I2510 Atherosclerotic heart disease of native coronary artery without angina pectoris: Secondary | ICD-10-CM | POA: Diagnosis not present

## 2023-10-24 DIAGNOSIS — Z952 Presence of prosthetic heart valve: Secondary | ICD-10-CM

## 2023-10-24 DIAGNOSIS — E785 Hyperlipidemia, unspecified: Secondary | ICD-10-CM

## 2023-10-24 DIAGNOSIS — I10 Essential (primary) hypertension: Secondary | ICD-10-CM

## 2023-10-24 DIAGNOSIS — I77810 Thoracic aortic ectasia: Secondary | ICD-10-CM

## 2023-10-24 DIAGNOSIS — I350 Nonrheumatic aortic (valve) stenosis: Secondary | ICD-10-CM

## 2023-10-24 NOTE — Patient Instructions (Signed)
Medication Instructions:  Your physician recommends that you continue on your current medications as directed. Please refer to the Current Medication list given to you today.  *If you need a refill on your cardiac medications before your next appointment, please call your pharmacy*   Lab Work: Fasting lipid panel & LFTS in 2 months.    Testing/Procedures: NONE ordered at this time of appointment     Follow-Up: At [facility], you and your health needs are our priority.  As part of our continuing mission to provide you with exceptional heart care, we have created designated Provider Care Teams.  These Care Teams include your primary Cardiologist (physician) and Advanced Practice Providers (APPs -  Physician Assistants and Nurse Practitioners) who all work together to provide you with the care you need, when you need it.  We recommend signing up for the patient portal called "MyChart".  Sign up information is provided on this After Visit Summary.  MyChart is used to connect with patients for Virtual Visits (Telemedicine).  Patients are able to view lab/test results, encounter notes, upcoming appointments, etc.  Non-urgent messages can be sent to your provider as well.   To learn more about what you can do with MyChart, go to [email or web address].    Your next appointment:   6 month(s)  Provider:   Nicki Guadalajara, MD     Other Instructions

## 2023-10-24 NOTE — Progress Notes (Signed)
Office Visit    Patient Name: Darren Lyons Date of Encounter: 10/24/2023  Primary Care Provider:  No primary care provider on file. Primary Cardiologist:  Nicki Guadalajara, MD  Chief Complaint    57 year old male with a history of severe aortic valve stenosis s/p aortic valve replacement, CAD, hypertension, hyperlipidemia, former tobacco use,  GERD, and depression who presents for follow-up related to CAD and aortic stenosis.   Past Medical History    Past Medical History:  Diagnosis Date   Depression    Essential hypertension 09/19/2020   GERD (gastroesophageal reflux disease)    Hyperlipidemia    Hypertension    Migraine    Mixed hyperlipidemia 09/19/2020   Other chest pain 09/19/2020   Other fatigue 09/19/2020   Skin cancer of arm, right    Past Surgical History:  Procedure Laterality Date   AORTIC VALVE REPLACEMENT N/A 06/17/2022   Procedure: AORTIC VALVE REPLACEMENT USING AN INSPIRIS VALVE;  Surgeon: Alleen Borne, MD;  Location: [facility] OR;  Service: Open Heart Surgery;  Laterality: N/A;   APPENDECTOMY     LEFT HEART CATH AND CORONARY ANGIOGRAPHY N/A 10/02/2020   Procedure: LEFT HEART CATH AND CORONARY ANGIOGRAPHY;  Surgeon: Lennette Bihari, MD;  Location: [facility] INVASIVE CV LAB;  Service: Cardiovascular;  Laterality: N/A;   RIGHT HEART CATH N/A 10/02/2020   Procedure: RIGHT HEART CATH;  Surgeon: Lennette Bihari, MD;  Location: [facility] INVASIVE CV LAB;  Service: Cardiovascular;  Laterality: N/A;   RIGHT HEART CATH AND CORONARY ANGIOGRAPHY N/A 05/07/2022   Procedure: RIGHT HEART CATH AND CORONARY ANGIOGRAPHY;  Surgeon: Lennette Bihari, MD;  Location: [facility] INVASIVE CV LAB;  Service: Cardiovascular;  Laterality: N/A;   TEE WITHOUT CARDIOVERSION N/A 06/17/2022   Procedure: TRANSESOPHAGEAL ECHOCARDIOGRAM (TEE);  Surgeon: Alleen Borne, MD;  Location: [facility] OR;  Service: Open Heart Surgery;  Laterality: N/A;    Allergies  Allergies  Allergen Reactions   Codeine Rash     Labs/Other  Studies Reviewed    The following studies were reviewed today:  Cardiac Studies & Procedures   CARDIAC CATHETERIZATION  CARDIAC CATHETERIZATION 05/07/2022  Narrative   Prox LAD lesion is 20% stenosed.   2nd Mrg lesion is 70% stenosed.   Prox RCA lesion is 20% stenosed.   There is severe aortic valve stenosis.  Mild nonobstructive CAD with 20% proximal LAD and mid RCA stenoses.  There is focal 70% stenosis in the OM vessel which is very small caliber.  Mildly increased right heart pressures with PA pressure 38/13; mean 20.  Severely calcified aortic valve with reduced excursion.  The valve was not crossed and on most recent echo Doppler study from April 2023, mean gradient 42 with peak instantaneous gradient 75.7 millimeters mercury and aortic valve area at 0.8 cm.  RECOMMENDATION: The patient will be referred to Dr. Evelene Croon for surgical evaluation of his aortic stenosis.  Findings Coronary Findings Diagnostic  Dominance: Right  Left Anterior Descending Prox LAD lesion is 20% stenosed.  Left Circumflex  First Obtuse Marginal Branch Vessel is small in size.  Second Obtuse Marginal Branch Vessel is small in size. 2nd Mrg lesion is 70% stenosed.  Right Coronary Artery Prox RCA lesion is 20% stenosed.  Intervention  No interventions have been documented.   CARDIAC CATHETERIZATION  CARDIAC CATHETERIZATION 10/02/2020  Narrative  2nd Mrg-1 lesion is 20% stenosed.  2nd Mrg-2 lesion is 20% stenosed.  Mid RCA lesion is 20% stenosed.  Prox LAD to Mid LAD  lesion is 20% stenosed.  The left ventricular systolic function is normal.  LV end diastolic pressure is normal.  The left ventricular ejection fraction is greater than 65% by visual estimate.  Mild nonobstructive CAD with common left main ostium with 20% proximal LAD narrowing before the first diagonal vessel; 20% circumflex stenoses; and 20% mid RCA stenosis in a dominant RCA supplying the entire  posterior wall to the apex.  Systemic hypertension,  Minimally elevated right heart pressures with mild pulmonary hypertension with a PA pressure mean at 26 mmHg.  Severe aortic valve stenosis with a mean gradient of 42.7 mmHg and a valve area of 0.6 cm.  RECOMMENDATION: I have ordered an echo Doppler study to be done following the patient's cardiac catheterization.  Cardiac auscultation reveals at least a 2/6 mid peaking systolic murmur in the aortic position consistent with his aortic stenosis.  Plan is for discharge later today.  He will follow up with Dr. Tomie China.  I suspect the patient's recent increasing symptomatology is secondary to his aortic valve stenosis rather than coronary obstructive disease.  Findings Coronary Findings Diagnostic  Dominance: Right  Left Anterior Descending Prox LAD to Mid LAD lesion is 20% stenosed.  Left Circumflex  First Obtuse Marginal Branch Vessel is small in size.  Second Obtuse Marginal Branch 2nd Mrg-1 lesion is 20% stenosed. 2nd Mrg-2 lesion is 20% stenosed.  Right Coronary Artery Mid RCA lesion is 20% stenosed.  Intervention  No interventions have been documented.     ECHOCARDIOGRAM  ECHOCARDIOGRAM COMPLETE 09/02/2023  Narrative ECHOCARDIOGRAM REPORT    Patient Name:   Darren Lyons  Date of Exam: 09/02/2023 Medical Rec #:  [id number]     Height:       67.0 in Accession #:    [id number]    Weight:       198.6 lb Date of Birth:  1966-05-06     BSA:          2.016 m Patient Age:    57 years      BP:           152/95 mmHg Patient Gender: M             HR:           61 bpm. Exam Location:  [facility]  Procedure: Cardiac Doppler, Color Doppler, 2D Echo and 3D Echo  Indications:    S/p Aortic Valve Replacement Z95.2  History:        Patient has prior history of Echocardiogram examinations, most recent 07/30/2022. Risk Factors:Hypertension and Dyslipidemia. Aortic Valve: 23 mm Edwards Inspiris valve is present in the aortic  position. Procedure Date: 06/17/2022.  Sonographer:    Thurman Coyer RDCS Referring Phys: [phone] THOMAS A KELLY   Sonographer Comments: Global longitudinal strain was attempted. IMPRESSIONS   1. Left ventricular ejection fraction, by estimation, is 60 to 65%. The left ventricle has normal function. The left ventricle has no regional wall motion abnormalities. Left ventricular diastolic parameters were normal. 2. Right ventricular systolic function is normal. The right ventricular size is normal. There is normal pulmonary artery systolic pressure. The estimated right ventricular systolic pressure is 22.5 mmHg. 3. The mitral valve is normal in structure. Trivial mitral valve regurgitation. No evidence of mitral stenosis. 4. The aortic valve has been repaired/replaced. Aortic valve regurgitation is not visualized. There is a 23 mm Edwards Inspiris valve present in the aortic position. Procedure Date: 06/17/2022. Vmax 2.1 m/s, MG , EOA 1.7cm^2, DI 0.45 Echo  findings are consistent with normal structure and function of the aortic valve prosthesis. 5. Aortic dilatation noted. There is dilatation of the ascending aorta, measuring 42 mm. 6. The inferior vena cava is normal in size with greater than 50% respiratory variability, suggesting right atrial pressure of 3 mmHg.  FINDINGS Left Ventricle: Left ventricular ejection fraction, by estimation, is 60 to 65%. The left ventricle has normal function. The left ventricle has no regional wall motion abnormalities. The left ventricular internal cavity size was normal in size. There is no left ventricular hypertrophy. Left ventricular diastolic parameters were normal.  Right Ventricle: The right ventricular size is normal. No increase in right ventricular wall thickness. Right ventricular systolic function is normal. There is normal pulmonary artery systolic pressure. The tricuspid regurgitant velocity is 2.21 m/s, and with an assumed right atrial  pressure of 3 mmHg, the estimated right ventricular systolic pressure is 22.5 mmHg.  Left Atrium: Left atrial size was normal in size.  Right Atrium: Right atrial size was normal in size.  Pericardium: Trivial pericardial effusion is present. Presence of epicardial fat layer.  Mitral Valve: The mitral valve is normal in structure. Mild mitral annular calcification. Trivial mitral valve regurgitation. No evidence of mitral valve stenosis.  Tricuspid Valve: The tricuspid valve is normal in structure. Tricuspid valve regurgitation is trivial.  Aortic Valve: The aortic valve has been repaired/replaced. Aortic valve regurgitation is not visualized. Aortic valve mean gradient measures 7.5 mmHg. Aortic valve peak gradient measures 15.3 mmHg. Aortic valve area, by VTI measures 1.86 cm. There is a 23 mm Edwards Inspiris valve present in the aortic position. Procedure Date: 06/17/2022. Echo findings are consistent with normal structure and function of the aortic valve prosthesis.  Pulmonic Valve: The pulmonic valve was not well visualized. Pulmonic valve regurgitation is not visualized.  Aorta: The aortic root is normal in size and structure and aortic dilatation noted. There is dilatation of the ascending aorta, measuring 42 mm.  Venous: The inferior vena cava is normal in size with greater than 50% respiratory variability, suggesting right atrial pressure of 3 mmHg.  IAS/Shunts: No atrial level shunt detected by color flow Doppler.   LEFT VENTRICLE PLAX 2D LVIDd:         4.10 cm   Diastology LVIDs:         2.50 cm   LV e' medial:    9.25 cm/s LV PW:         1.00 cm   LV E/e' medial:  11.1 LV IVS:        1.00 cm   LV e' lateral:   9.14 cm/s LVOT diam:     2.20 cm   LV E/e' lateral: 11.3 LV SV:         77 LV SV Index:   38 LVOT Area:     3.80 cm  3D Volume EF: 3D EF:        55 % LV EDV:       102 ml LV ESV:       46 ml LV SV:        56 ml  RIGHT VENTRICLE RV Basal diam:  3.30 cm RV  Mid diam:    2.70 cm RV S prime:     8.92 cm/s TAPSE (M-mode): 2.0 cm  LEFT ATRIUM             Index        RIGHT ATRIUM  Index LA diam:        3.80 cm 1.88 cm/m   RA Area:     20.30 cm LA Vol (A2C):   38.3 ml 19.00 ml/m  RA Volume:   55.40 ml  27.48 ml/m LA Vol (A4C):   22.2 ml 11.01 ml/m LA Biplane Vol: 28.1 ml 13.94 ml/m AORTIC VALVE AV Area (Vmax):    1.76 cm AV Area (Vmean):   1.79 cm AV Area (VTI):     1.86 cm AV Vmax:           195.50 cm/s AV Vmean:          130.000 cm/s AV VTI:            0.414 m AV Peak Grad:      15.3 mmHg AV Mean Grad:      7.5 mmHg LVOT Vmax:         90.70 cm/s LVOT Vmean:        61.300 cm/s LVOT VTI:          0.202 m LVOT/AV VTI ratio: 0.49  AORTA Ao Root diam: 3.20 cm Ao Asc diam:  4.20 cm  MITRAL VALVE                TRICUSPID VALVE MV Area (PHT): 3.72 cm     TR Peak grad:   19.5 mmHg MV Decel Time: 204 msec     TR Vmax:        221.00 cm/s MV E velocity: 103.00 cm/s MV A velocity: 79.70 cm/s   SHUNTS MV E/A ratio:  1.29         Systemic VTI:  0.20 m Systemic Diam: 2.20 cm  Epifanio Lesches MD Electronically signed by Epifanio Lesches MD Signature Date/Time: 09/02/2023/12:55:48 PM    Final   TEE  ECHO INTRAOPERATIVE TEE 06/17/2022  Narrative *INTRAOPERATIVE TRANSESOPHAGEAL REPORT *    Patient Name:   Marcanthony Reister Date of Exam: 06/17/2022 Medical Rec #:  [id number]    Height:       67.0 in Accession #:    [id number]   Weight:       175.0 lb Date of Birth:  [birth year]    BSA:          1.91 m Patient Age:    56 years     BP:           151/95 mmHg Patient Gender: M            HR:           70 bpm. Exam Location:  Inpatient  Transesophogeal exam was perform intraoperatively during surgical procedure. Patient was closely monitored under general anesthesia during the entirety of examination.  Indications:     Aortic valve stenosis, etiology of cardiac valve disease unspecified [I35.0  (ICD-10-CM)] Performing Phys: [id number] BARTLE Diagnosing Phys: Heather Roberts MD  Complications: No known complications during this procedure. POST-OP IMPRESSIONS _ Aortic Valve: No stenosis present. A bioprosthetic bioprosthetic valve was placed, leaflets are freely mobile and leaflets thin Size; 23mm. There is no regurgitation. _ Comments: AVR in good postion, no perivalvular leak or stenosis. Exam otherwise unchanged.  PRE-OP FINDINGS Left Ventricle: The left ventricle has normal systolic function, with an ejection fraction of 60-65%. The cavity size was normal. There is severe left ventricular hypertrophy.   Right Ventricle: The right ventricle has normal systolic function. The cavity was normal. There is no increase in right ventricular wall thickness.  Left Atrium:  Left atrial size was normal in size. No left atrial/left atrial appendage thrombus was detected.  Right Atrium: Right atrial size was normal in size.  Interatrial Septum: No atrial level shunt detected by color flow Doppler.  Pericardium: There is no evidence of pericardial effusion.  Mitral Valve: The mitral valve is normal in structure. Mitral valve regurgitation is not visualized by color flow Doppler. There is No evidence of mitral stenosis.  Tricuspid Valve: The tricuspid valve was normal in structure. Tricuspid valve regurgitation was not visualized by color flow Doppler.  Aortic Valve: The aortic valve is bicuspid Aortic valve regurgitation was not visualized by color flow Doppler. There is severe stenosis of the aortic valve, with a calculated valve area of 0.63 cm.   Pulmonic Valve: The pulmonic valve was normal in structure. Pulmonic valve regurgitation is not visualized by color flow Doppler.   Aorta: The ascending aorta is normal in size and structure. There is evidence of plaque in the descending aorta; Grade II, measuring 2-27mm in size.  +--------------+--------++ LEFT VENTRICLE          +--------------+--------++ PLAX 2D                +--------------+--------++ LVOT diam:    2.20 cm  +--------------+--------++ LVOT Area:    3.80 cm +--------------+--------++                        +--------------+--------++  +------------------+------------++ AORTIC VALVE                   +------------------+------------++ AV Area (Vmax):   0.57 cm     +------------------+------------++ AV Area (Vmean):  0.58 cm     +------------------+------------++ AV Area (VTI):    0.63 cm     +------------------+------------++ AV Vmax:          476.00 cm/s  +------------------+------------++ AV Vmean:         317.000 cm/s +------------------+------------++ AV VTI:           0.852 m      +------------------+------------++ AV Peak Grad:     90.6 mmHg    +------------------+------------++ AV Mean Grad:     48.0 mmHg    +------------------+------------++ LVOT Vmax:        71.10 cm/s   +------------------+------------++ LVOT Vmean:       48.500 cm/s  +------------------+------------++ LVOT VTI:         0.142 m      +------------------+------------++ LVOT/AV VTI ratio:0.17         +------------------+------------++  +-------------+-------++ AORTA                +-------------+-------++ Ao Root diam:3.00 cm +-------------+-------++   +--------------+-------+ SHUNTS                +--------------+-------+ Systemic VTI: 0.14 m  +--------------+-------+ Systemic Diam:2.20 cm +--------------+-------+   Heather Roberts MD Electronically signed by Heather Roberts MD Signature Date/Time: 06/17/2022/2:37:36 PM    Final      PYP SCAN  MYOCARDIAL AMYLOID PLANAR AND SPECT 10/26/2021  Interpretation Summary   Heart to contralateral lung ratio is between 1-1.5, indeterminate for amyloid. By semi-quantitative assessment scan is consistent with increased heart uptake but less than rib uptake-Grade  1.   Study is equivocal for TTR amyloidosis (visual score of 1/ratio between 1-1.5).   Prior study not available for comparison.  Overall, findings are equivocal for ATTR amyloidosis. Consider additional testing if clinically indicated.       Recent  Labs: 11/25/2022: ALT 10  Recent Lipid Panel    Component Value Date/Time   CHOL 135 11/25/2022 1036   TRIG 141 11/25/2022 1036   HDL 40 11/25/2022 1036   CHOLHDL 3.4 11/25/2022 1036   LDLCALC 70 11/25/2022 1036    History of Present Illness    57 year old male with the above past medical history of severe aortic valve stenosis s/p valve replacement, CAD, hypertension, hyperlipidemia, former tobacco use, GERD, and depression.   He was previously evaluated by Dr. Tomie China in October 2021.  He was referred to Dr. Tresa Endo for diagnostic cardiac catheterization in the setting of episodic chest pain which revealed aortic valve stenosis.  Right heart pressures were upper normal and his aortic valve peak to peak gradient was 43 mmHg with mean gradient of 42 mmHg.  He was found to have mild nonobstructive CAD with 20% stenosis in the LAD, circumflex marginal, and RCA.  Echocardiogram in October 2022 showed EF 60 to 65%, mild LVH, G2 DD, mild biatrial enlargement, suggestion of bicuspid aortic valve with moderate to severe stenosis with a mean gradient of 8.6 and peak gradient of 65.8, mild dilation of ascending aorta 39 mm.  He also underwent amyloid study which was equivocal.  Repeat echocardiogram in April 2023 showed normal LV function, EF 65 to 70%, moderate LVH, significant calcification of the aortic valve with mean gradient 42 mmHg, aortic valve peak gradient 75.7 mmHg. Repeat R/LHC on 05/07/2022 revealed mild nonobstructive CAD with 20% proximal LAD and mid RCA stenoses, focal 70% stenosis in the OM (small caliber vessel), mildly increased right heart pressures with PA pressure 38/13, mean 20 mmHg, heavily calcified aortic valve with reduced  excursion, mean gradient 42 mmHg, with peak instantaneous gradient 75.7 mmHg.  He was referred to CT surgery and underwent aortic valve replacement with an Inspiris Resilia Biprosthetic 23 mm valve on 06/17/2022. Presurgery carotid dopplers revealed 1 to 39% B ICA stenosis.  He did have postop hypertension and was started on losartan, additionally, atenolol was increased.  Postop echocardiogram in 07/2022 showed EF 60 to 65%, normally functioning AVR, mild dilation of ascending aorta, 41 mm.  He was last seen in the office on 04/02/2023 and was stable from a cardiac standpoint.  His BP was elevated.  Losartan was switched to losartan HCTZ 100/12.5 mg daily.  Sleep study was recommended.  Repeat echocardiogram for routine monitoring in 08/2023 showed EF 60 to 65%, normal LV function, no RWMA, normal RV, stable aortic valve prosthesis, mild dilation of the ascending aorta measuring 42 mm.   He presents today for follow-up.  Since his last visit he has been stable overall from a cardiac standpoint.  He does note intermittent left arm/shoulder pain with significant physical exertion such as when climbing hills while playing disc golf.  He denies any palpitations, dizziness, dyspnea, edema, PND, orthopnea, weight gain.  Overall, his symptoms have been stable.  Home Medications    Current Outpatient Medications  Medication Sig Dispense Refill   acetaminophen (TYLENOL) 500 MG tablet Take 1-2 tablets (500-1,000 mg total) by mouth every 6 (six) hours. 30 tablet 0   amoxicillin (AMOXIL) 500 MG capsule Take four 500 mg capsules 30-60 mins prior to dental procedures. 4 capsule 3   aspirin EC 81 MG tablet Take 1 tablet (81 mg total) by mouth daily. 90 tablet 3   atenolol (TENORMIN) 50 MG tablet Take 1 tablet by mouth once daily 90 tablet 2   fluticasone (FLONASE) 50 MCG/ACT nasal spray PLACE 2  SPRAYS INTO BOTH NOSTRILS DAILY AS NEEDED FOR ALLERGIES OR RHINITIS 48 g 0   furosemide (LASIX) 40 MG tablet Take 1 tablet (40  mg total) by mouth daily as needed. For weight gain of 3 lbs in 24 hours or 5 lbs in 1 week 30 tablet 1   losartan-hydrochlorothiazide (HYZAAR) 100-12.5 MG tablet Take 1 tablet by mouth daily. 90 tablet 3   nitroGLYCERIN (NITROSTAT) 0.4 MG SL tablet Place 1 tablet (0.4 mg total) under the tongue every 5 (five) minutes as needed for chest pain. 25 tablet 3   omeprazole (PRILOSEC) 40 MG capsule Take 1 capsule by mouth once daily 90 capsule 0   PARoxetine (PAXIL) 10 MG tablet Take 1 tablet by mouth once daily 90 tablet 0   potassium chloride SA (KLOR-CON M) 20 MEQ tablet Take 1 tablet (20 mEq total) by mouth daily as needed. Only take on days you take a Lasix 30 tablet 1   rosuvastatin (CRESTOR) 20 MG tablet Take 1 tablet by mouth once daily 90 tablet 1   No current facility-administered medications for this visit.     Review of Systems    He denies chest pain, palpitations, dyspnea, pnd, orthopnea, n, v, dizziness, syncope, edema, weight gain, or early satiety. All other systems reviewed and are otherwise negative except as noted above.   Physical Exam    VS:  BP 124/68 (BP Location: Left Arm, Patient Position: Sitting, Cuff Size: Normal)   Pulse 68   Ht 5\' 7"  (1.702 m)   Wt 209 lb (94.8 kg)   SpO2 95%   BMI 32.73 kg/m   GEN: Well nourished, well developed, in no acute distress. HEENT: normal. Neck: Supple, no JVD, carotid bruits, or masses. Cardiac: RRR, no murmurs, rubs, or gallops. No clubbing, cyanosis, edema.  Radials/DP/PT 2+ and equal bilaterally.  Respiratory:  Respirations regular and unlabored, clear to auscultation bilaterally. GI: Soft, nontender, nondistended, BS + x 4. MS: no deformity or atrophy. Skin: warm and dry, no rash. Neuro:  Strength and sensation are intact. Psych: Normal affect.  Accessory Clinical Findings    ECG personally reviewed by me today -    - no EKG in office today.   Lab Results  Component Value Date   WBC 7.7 09/12/2022   HGB 15.3  09/12/2022   HCT 44.9 09/12/2022   MCV 83 09/12/2022   PLT 209 09/12/2022   Lab Results  Component Value Date   CREATININE 1.15 09/12/2022   BUN 15 09/12/2022   NA 142 09/12/2022   K 5.0 09/12/2022   CL 100 09/12/2022   CO2 24 09/12/2022   Lab Results  Component Value Date   ALT 10 11/25/2022   AST 17 11/25/2022   ALKPHOS 101 11/25/2022   BILITOT 0.7 11/25/2022   Lab Results  Component Value Date   CHOL 135 11/25/2022   HDL 40 11/25/2022   LDLCALC 70 11/25/2022   TRIG 141 11/25/2022   CHOLHDL 3.4 11/25/2022    Lab Results  Component Value Date   HGBA1C 5.4 06/13/2022    Assessment & Plan   1. Severe aortic stenosis: S/p AVR in 05/2022. Repeat echocardiogram for routine monitoring in 08/2023 showed EF 60 to 65%, normal LV function, no RWMA, normal RV, stable aortic valve prosthesis, mild dilation of the ascending aorta measuring 42 mm. Euvolemic and well compensated on exam. Continue SBE prophylaxis. Continue aspirin, Lasix as needed.  Consider repeat echocardiogram in 08/2024.  2.  CAD: LHC on 05/07/2022 revealed  mild nonobstructive CAD with 20% proximal LAD and mid RCA stenoses, focal 70% stenosis in the OM (small caliber vessel).  He notes occasional left upper arm/shoulder discomfort with significant exertion such as when climbing hills.  We discussed possible introduction of antianginal therapy, he declines at this time.  Reviewed ED precautions.  Continue to monitor symptoms.  Continue aspirin, losartan-hctz, rosuvastatin.  3. Hypertension: BP well controlled. Continue current antihypertensive regimen.    4. Hyperlipidemia: LDL was 70 in 11/2022, above goal. Will repeat fasting lipids, LFTs.  Continue Crestor.   5.  Dilation of ascending aorta: Most recent echo showed mild dilation of the ascending aorta measuring 42 mm. Continue to monitor with routine echocardiograms for aortic stenosis.  6.  Screening for sleep apnea: He was previously referred for sleep study, this  was not completed.  He is not interested in pursuing sleep study at this time.    7. Disposition: Follow-up in 6 months with Dr. Tresa Endo.       Joylene Grapes, NP 10/24/2023, 2:07 PM

## 2024-03-25 ENCOUNTER — Other Ambulatory Visit: Payer: Self-pay | Admitting: Cardiovascular Disease

## 2024-06-13 ENCOUNTER — Other Ambulatory Visit (HOSPITAL_BASED_OUTPATIENT_CLINIC_OR_DEPARTMENT_OTHER): Payer: Self-pay | Admitting: Cardiovascular Disease

## 2024-08-24 NOTE — Progress Notes (Signed)
 [facility] [address] [place], [place] [address]  Subjective:    Patient ID: Darren Lyons is a 58 y.o. (DOB 1966/02/19) male. Vitals:   08/24/24 1138 08/24/24 1242  BP: (!) 84/60 (!) 86/60  Pulse: 69   Temp: 97.8 F (36.6 C)   TempSrc: Temporal   Resp: 14   Height: 5' 8.5 (1.74 m)   Weight: 193 lb 12.8 oz (87.9 kg)   SpO2: 96%   BMI (Calculated): 29   PainSc:   3   PainLoc: Groin        Patient presents with  . Fatigue  . office visit    Pt presents today with dry mouth, blurred vision, muscle fatigue and some dizziness and excessive thirst x 3 weeks. Pt states that he has only taken usual prescribed medications. Pt states that he has family hx of diabetes.   . Urinary Frequency  . Groin Pain    Pt states that he has been experiencing some rt side groin pain.   History of Present Illness The patient is a 58 year old male who presents for evaluation of dry mouth, blurred vision, dizziness, excessive thirst, groin pain  He reports experiencing dry mouth, blurred vision, dizziness, and excessive thirst. His appetite has increased, particularly in the mornings. He also notes frequent urination and constant thirst, consuming 4 to 5 bottles of water daily along with occasional soda. His diet includes hamburgers a couple of times a week and pasta, but he does not consume much white bread or rice. He has been feeling fatigued, similar to muscle fatigue, with weakness in his shoulders, arms, and legs.   He complains of blurred vision that started about a month ago and has been constant, making it difficult for him to drive. It has worsened over the past few days. He has not seen an eye doctor in over a year.  He eats a lot of fruits. He does not use alcohol. He has previously checked his blood sugar levels at home. He is currently taking vitamin D3 and B1 supplements. He recently had a dental cleaning and was prescribed lozenges and a spray for his dry  mouth, which provided temporary relief.  HYPOTENSION He experiences intermittent lightheadedness but no fainting episodes. He last took Lasix  3 days ago. He has noticed slightly low blood pressure readings in the past few months. He takes his blood pressure medication in the morning. He has not experienced any falls or trauma.  GROIN PAIN He has been experiencing right groin pain for the past 2 months, which varies in intensity. The area is tender to touch. The pain is intermittent and can be severe, rating it as a 10 on a scale of 1 to 10. He does not take any medication for the pain. He experiences daily pain, which is worse on some days than others. He engages in strenuous activities such as lifting furniture. He also reports testicular pain and tenderness but no swelling. He has a history of an umbilical hernia, which does not cause him pain unless hit. He has a long-standing issue with a rash in the same area, similar to a yeast infection, which worsens if he does not maintain good hygiene.  He does not engage in regular exercise or stretching. He usually plays golf every week but has been unable to do so due to heat intolerance. He played golf yesterday but struggled due to his vision and dizziness.  He has experienced constipation the past few  days, with infrequent bowel movements every 2 days. He has hemorrhoids and has noticed bright red blood on the tissue paper for about a week, which he attributes to straining during bowel movements. He has had 2 colonoscopies in the past, the first of which revealed polyps that were removed, while the second was normal.   Diet: Hamburgers, pasta, fruits Alcohol: None  PAST SURGICAL HISTORY: Heart surgery Umbilical hernia repair Two colonoscopies with polyp removal  Review of Systems  Constitutional:  Positive for fatigue. Negative for activity change, appetite change, chills, diaphoresis, fever and unexpected weight change.  Eyes:  Positive for  visual disturbance. Negative for photophobia, pain, discharge, redness and itching.  Respiratory: Negative.    Cardiovascular: Negative.   Gastrointestinal:  Positive for constipation. Negative for abdominal distention, abdominal pain, anal bleeding, blood in stool, diarrhea, nausea, rectal pain and vomiting.  Endocrine: Positive for polydipsia, polyphagia and polyuria. Negative for cold intolerance and heat intolerance.  Genitourinary:  Positive for frequency. Negative for decreased urine volume, difficulty urinating, dysuria, enuresis, flank pain, genital sores, hematuria, penile discharge, penile pain, penile swelling, scrotal swelling, testicular pain and urgency.       Groin pain   Neurological:  Positive for dizziness and weakness. Negative for tremors, seizures, syncope, facial asymmetry, speech difficulty, light-headedness, numbness and headaches.    Objective:   Physical Exam Vitals and nursing note reviewed.  Constitutional:      General: He is not in acute distress.    Appearance: Normal appearance. He is overweight. He is not ill-appearing.  HENT:     Head: Normocephalic and atraumatic.     Right Ear: Tympanic membrane, ear canal and external ear normal.     Left Ear: Tympanic membrane, ear canal and external ear normal.     Nose: Nose normal.  Eyes:     General: Lids are normal.     Conjunctiva/sclera: Conjunctivae normal.     Pupils: Pupils are equal, round, and reactive to light.  Cardiovascular:     Rate and Rhythm: Normal rate and regular rhythm.     Pulses: Normal pulses.          Radial pulses are 2+ on the right side and 2+ on the left side.       Posterior tibial pulses are 2+ on the right side and 2+ on the left side.     Heart sounds: Normal heart sounds, S1 normal and S2 normal. Heart sounds not distant. No murmur heard. Musculoskeletal:        General: Normal range of motion.     Cervical back: Full passive range of motion without pain and normal range of  motion.  Pulmonary:     Effort: Pulmonary effort is normal. No respiratory distress.     Breath sounds: Normal breath sounds. No stridor or decreased air movement. No decreased breath sounds, wheezing, rhonchi or rales.  Abdominal:     General: Bowel sounds are normal.     Palpations: Abdomen is soft.     Tenderness: There is abdominal tenderness in the right lower quadrant. There is no right CVA tenderness or left CVA tenderness.  Skin:    General: Skin is warm and dry.  Neurological:     General: No focal deficit present.     Mental Status: He is alert and oriented to person, place, and time. Mental status is at baseline.  Psychiatric:        Attention and Perception: Attention normal.  Mood and Affect: Mood normal.        Speech: Speech normal.        Behavior: Behavior normal. Behavior is cooperative.        Thought Content: Thought content normal.        Judgment: Judgment normal.     Assessment / Plan:   Assessment 1. Type 2 diabetes mellitus without complication, without long-term current use of insulin  (*)   2. Xerostomia   3. Fatigue, unspecified type   4. Urinary frequency   5. Right inguinal pain   6. Screening for colorectal cancer   7. Hypotension due to drugs    Plan Assessment & Plan 1. Diabetes mellitus: - Symptoms such as dry mouth, increased thirst, frequent urination, and blurred vision are likely due to this condition. - Hemoglobin A1c level is significantly elevated at >15.  -CMP and CBC will be ordered today to assess kidney function, electrolytes, liver function, and rule out anemia. - A referral to an endocrinologist will be made. He is advised to avoid high-carbohydrate foods such as white bread, white rice, and pasta, and to limit fruit intake due to their high sugar content. Alcohol consumption should be avoided as it can elevate blood sugar levels. A diet rich in vegetables, lean meat, and whole grains is recommended. He should also avoid  adding salt to his food and limit intake of sugary foods and beverages.  -Information on Lantus, diabetes, and dietary recommendations will be provided. He is advised to increase his water intake to 6 to 7 bottles daily.  -A Cologuard test will be ordered for him. - He will be started on long-acting Lantus 15 UNITS each night and metformin XR 750 MG  once daily with breakfast or lunch. - A glucometer machine, lancets, and strips will be provided for him to monitor his blood sugar levels daily. If he tolerates the metformin well over the next 2 weeks, the dosage will be increased to twice daily.  2. Hypotension -His blood pressure readings today were 84/60 and 86/60, indicating hypotension. - He is advised to monitor his blood pressure at home before taking his medication in the morning and to avoid taking his blood pressure medication if his reading is <90/70. He should recheck his blood pressure in the afternoon and record the readings. -He is advised to decrease Lasix  from 40 MG to 20 MG as needed when swelling occurs.  - He will start taking losartan  100 mg, without HCTZ once daily on Tuesday, Wednesday, and Thursday  3. Groin pain: - He reports groin pain that has been present for about 2 months, sometimes severe enough to rate as a 10/10. The pain is tender to touch and worsens with standing or walking. - Imaging will be ordered to investigate the cause of the groin pain.  4. Fatigue: - He reports muscle fatigue, particularly in his shoulders and arms, which he attributes to inactivity. - He is advised to start stretching exercises and consider over-the-counter Tylenol  or ibuprofen  for pain relief. Blood work will be ordered today to check for any internal causes of his symptoms.  5. Constipation: - He reports constipation and occasional bright red blood on tissue paper, likely due to hemorrhoids. - He is advised to increase his fiber intake and continue drinking 4-6 bottles of water  daily. MiraLAX once daily is recommended until his bowel movements become regular. -Cologuard ordered today   6. Blurred vision: - He reports constant blurred vision that has worsened over the  past few days. - He is advised to see an eye doctor to ensure there are no other vision changes.  7. Xerostomia --Symptoms likely related to diabetes -Recommend use of Biotin mouthwash twice daily  -Follow up with Dental  Follow-up: A follow-up visit is scheduled in 2 weeks for a blood pressure recheck and toleration of Metformin    Patient's Medications      * Accurate as of August 24, 2024 11:59 PM. Reflects encounter med changes as of last refresh        New Prescriptions     Instructions  Blood Glucose Monitoring Suppl Devi Started by: Hyla Merino, NP  1 each, Does not apply, Daily, Use as directed.  Pharmacy, please dispense this brand of blood glucose monitoring device: Accu-Check Aviva Expert   glucose blood test strip Started by: Hyla Merino, NP  Use to check blood sugar daily.   Insulin  Pen Needle 32G X 4 MM Misc Commonly known as: BD,SURE COMFORT,NOVOFINE Started by: Hyla Merino, NP  Use with insulin  pen as directed   LANTUS SOLOSTAR 100 UNIT/ML Sopn Started by: Hyla Merino, NP  15 Units, Subcutaneous, Daily, Inject 15 UNITS into the skin each night.   losartan  potassium 100 mg tablet Commonly known as: COZAAR  Started by: Hyla Merino, NP  100 mg, Oral, Daily   metFORMIN ER 750 MG 24 hr tablet Commonly known as: GLUCOPHAGE-XR Started by: Hyla Merino, NP  750 mg, Oral, Daily   ONETOUCH ULTRASOFT LANCETS lancets Started by: Hyla Merino, NP  Use with glucose monitor      Continued Medications     Instructions  acetaminophen  500 mg tablet Commonly known as: TYLENOL   500-1,000 mg, Every 6 hours scheduled   amoxicillin  500 mg capsule Commonly known as: AMOXIL   500 mg, Daily as needed   aspirin  EC tablet Commonly known  as: ECOTRIN LOW DOSE  81 mg, Daily   atenolol  50 mg tablet Commonly known as: TENORMIN   50 mg, Daily   fluticasone  propionate 50 mcg/actuation nasal spray Commonly known as: FLONASE   2 sprays, Daily as needed   nitroGLYCERIN  0.4 mg SL tablet Commonly known as: NITROSTAT   0.4 mg, Every 5 minutes as needed   omeprazole  40 mg capsule Commonly known as: PRILOSEC  40 mg, Oral, Daily   paroxetine  10 mg tablet Commonly known as: PAXIL   10 mg, Oral, Daily   potassium chloride  20 mEq CR tablet Commonly known as: K-DUR,KLOR-CON   20 mEq, Oral, Daily as needed   rosuvastatin  calcium  20 mg tablet Commonly known as: CRESTOR   20 mg, Oral, At bedtime   thiamine 50 mg tablet Commonly known as: VITAMIN B-1  50 mg, Daily   vitamin D-3 25 mcg (1000 UT) Caps capsule  1 capsule, Daily      Modified Medications     Instructions  furosemide  40 mg tablet Commonly known as: LASIX  What changed:  how much to take additional instructions Changed by: Hyla Merino, NP  20 mg, Oral, Every 24 hours scheduled, Take when swelling occurs      Discontinued Medications   losartan -hydrochlorothiazide 100-25 MG per tablet Commonly known as: HYZAAR Stopped by: Hyla Merino, NP        Follow up in about 2 weeks (around 09/07/2024) for BP recheck .SABRA  Risks, benefits, and alternatives of the medications and treatment plan prescribed today were discussed, and patient expressed understanding. Plan follow-up as discussed or as needed if any worsening symptoms or change in condition.  I have reviewed the information contained in this note and personally verified its accuracy.  I obtained the history of present illness and personally performed the physical exam.  AI technology was used to create to visit note. Consent was obtained from patient/caregiver prior to its use.    Hyla Merino, NP      *Some images could not be shown.
